# Patient Record
Sex: Male | Born: 1955 | Race: White | Hispanic: No | Marital: Married | Smoking: Former smoker
Health system: Southern US, Community
[De-identification: ages and names within clinical notes are randomized; demographics above are authoritative.]

## PROBLEM LIST (undated history)

## (undated) DIAGNOSIS — K5909 Other constipation: Secondary | ICD-10-CM

## (undated) HISTORY — DX: Other constipation: K59.09

## (undated) HISTORY — PX: HERNIA REPAIR: SHX51

---

## 1998-12-28 ENCOUNTER — Encounter: Payer: Self-pay | Admitting: *Deleted

## 1998-12-28 ENCOUNTER — Emergency Department (HOSPITAL_COMMUNITY): Admission: EM | Admit: 1998-12-28 | Discharge: 1998-12-28 | Payer: Self-pay | Admitting: *Deleted

## 2006-01-08 LAB — HM COLONOSCOPY

## 2006-11-04 HISTORY — PX: MOUTH SURGERY: SHX715

## 2012-05-22 ENCOUNTER — Ambulatory Visit (INDEPENDENT_AMBULATORY_CARE_PROVIDER_SITE_OTHER): Payer: BC Managed Care – PPO | Admitting: General Surgery

## 2012-06-10 ENCOUNTER — Ambulatory Visit (INDEPENDENT_AMBULATORY_CARE_PROVIDER_SITE_OTHER): Payer: BC Managed Care – PPO | Admitting: General Surgery

## 2012-06-11 ENCOUNTER — Ambulatory Visit (INDEPENDENT_AMBULATORY_CARE_PROVIDER_SITE_OTHER): Payer: BC Managed Care – PPO | Admitting: General Surgery

## 2012-06-11 ENCOUNTER — Encounter (INDEPENDENT_AMBULATORY_CARE_PROVIDER_SITE_OTHER): Payer: Self-pay | Admitting: General Surgery

## 2012-06-11 VITALS — BP 120/90 | HR 60 | Temp 97.8°F | Resp 16 | Ht 72.0 in | Wt 201.0 lb

## 2012-06-11 DIAGNOSIS — K4090 Unilateral inguinal hernia, without obstruction or gangrene, not specified as recurrent: Secondary | ICD-10-CM

## 2012-06-11 MED ORDER — TAMSULOSIN HCL 0.4 MG PO CAPS: 0.4000 mg | ORAL_CAPSULE | Freq: Every day | ORAL | Status: DC

## 2012-06-11 NOTE — Patient Instructions (Signed)
Hernia Repair with Laparoscope A hernia occurs when an internal organ pushes out through a weak spot in the belly (abdominal) wall muscles. Hernias most commonly occur in the groin and around the navel. Hernias can also occur through a cut by the surgeon (incision) after an abdominal operation. A hernia may be caused by:  Lifting heavy objects.   Prolonged coughing.   Straining to move your bowels.  Hernias can often be pushed back into place (reduced). Most hernias tend to get worse over time. Problems occur when abdominal contents get stuck in the opening and the blood supply is blocked or impaired (incarcerated hernia). Because of these risks, you require surgery to repair the hernia. Your hernia will be repaired using a laparoscope. Laparoscopic surgery is a type of minimally invasive surgery. It does not involve making a typical surgical cut (incision) in the skin. A laparoscope is a telescope-like rod and lens system. It is usually connected to a video camera and a light source so your caregiver can clearly see the operative area. The instruments are inserted through  to  inch (5 mm or 10 mm) openings in the skin at specific locations. A working and viewing space is created by blowing a small amount of carbon dioxide gas into the abdominal cavity. The abdomen is essentially blown up like a balloon (insufflated). This elevates the abdominal wall above the internal organs like a dome. The carbon dioxide gas is common to the human body and can be absorbed by tissue and removed by the respiratory system. Once the repair is completed, the small incisions will be closed with either stitches (sutures) or staples (just like a paper stapler only this staple holds the skin together). LET YOUR CAREGIVERS KNOW ABOUT:  Allergies.   Medications taken including herbs, eye drops, over the counter medications, and creams.   Use of steroids (by mouth or creams).   Previous problems with anesthetics or  Novocaine.   Possibility of pregnancy, if this applies.   History of blood clots (thrombophlebitis).   History of bleeding or blood problems.   Previous surgery.   Other health problems.  BEFORE THE PROCEDURE  Laparoscopy can be done either in a hospital or out-patient clinic. You may be given a mild sedative to help you relax before the procedure. Once in the operating room, you will be given a general anesthesia to make you sleep (unless you and your caregiver choose a different anesthetic).  AFTER THE PROCEDURE  After the procedure you will be watched in a recovery area. Depending on what type of hernia was repaired, you might be admitted to the hospital or you might go home the same day. With this procedure you may have less pain and scarring. This usually results in a quicker recovery and less risk of infection. HOME CARE INSTRUCTIONS   Bed rest is not required. You may continue your normal activities but avoid heavy lifting (more than 10 pounds) or straining.   Cough gently. If you are a smoker it is best to stop, as even the best hernia repair can break down with the continual strain of coughing.   Avoid driving until given the OK by your surgeon.   There are no dietary restrictions unless given otherwise.   TAKE ALL MEDICATIONS AS DIRECTED.   Only take over-the-counter or prescription medicines for pain, discomfort, or fever as directed by your caregiver.  SEEK MEDICAL CARE IF:   There is increasing abdominal pain or pain in your incisions.     There is more bleeding from incisions, other than minimal spotting.   You feel light headed or faint.   You develop an unexplained fever, chills, and/or an oral temperature above 102 F (38.9 C).   You have redness, swelling, or increasing pain in the wound.   Pus coming from wound.   A foul smell coming from the wound or dressings.  SEEK IMMEDIATE MEDICAL CARE IF:   You develop a rash.   You have difficulty breathing.    You have any allergic problems.  MAKE SURE YOU:   Understand these instructions.   Will watch your condition.   Will get help right away if you are not doing well or get worse.  Document Released: 10/21/2005 Document Revised: 10/10/2011 Document Reviewed: 09/20/2009 Exit[employer] Patient Information 2012 [employer]. 

## 2012-06-11 NOTE — Progress Notes (Signed)
Patient ID: Chad Fox, male   DOB: [birth year], 56 y.o.   MRN: [id number]  Chief Complaint  Patient presents with  . Inguinal Hernia    left    HPI Chad Fox is a 56 y.o. male.   HPI 56 year old Caucasian male comes in complaining of a left inguinal hernia. He states that this was discovered on a routine physical exam several months ago. He states that the area has gotten progressively worse over the past couple months. It bulges out more frequently and causes him more discomfort now. He describes the discomfort as a burning, stinging pain. He has always been able to reduce it. However, he states it is becoming a little more difficult to reduce it at times. He denies any fever, chills, nausea, vomiting, diarrhea. He does have baseline constipation and takes MiraLAX on a daily basis. He denies any dysuria. He denies any previous abdominal surgery  No past medical history on file.  Past Surgical History  Procedure Date  . Mouth surgery 2008    No family history on file.  Social History History  Substance Use Topics  . Smoking status: Former Smoker    Quit date: 06/11/1980  . Smokeless tobacco: Not on file  . Alcohol Use: No    No Known Allergies  Current Outpatient Prescriptions  Medication Sig Dispense Refill  . aspirin 81 MG tablet Take 81 mg by mouth daily.      . Multiple Vitamin (MULTIVITAMIN) tablet Take 1 tablet by mouth daily.      . polyethylene glycol (MIRALAX / GLYCOLAX) packet Take 17 g by mouth daily.      . Tamsulosin HCl (FLOMAX) 0.4 MG CAPS Take 1 capsule (0.4 mg total) by mouth daily.  10 capsule  0    Review of Systems Review of Systems  Constitutional: Negative for fever, chills, appetite change and unexpected weight change.  HENT: Negative for congestion and trouble swallowing.   Eyes: Negative for visual disturbance.  Respiratory: Negative for chest tightness and shortness of breath.   Cardiovascular: Negative for chest pain and leg  swelling.       No PND, no orthopnea, no DOE  Gastrointestinal: Positive for constipation.       See HPI  Genitourinary: Negative for dysuria and hematuria.       +Weak stream, frequency, nocturia  Musculoskeletal: Negative.   Skin: Negative for rash.  Neurological: Negative for seizures and speech difficulty.  Hematological: Does not bruise/bleed easily.  Psychiatric/Behavioral: Negative for behavioral problems and confusion.    Blood pressure 120/90, pulse 60, temperature 97.8 F (36.6 C), temperature source Temporal, resp. rate 16, height 6' (1.829 m), weight 201 lb (91.173 kg).  Physical Exam Physical Exam  Vitals reviewed. Constitutional: He is oriented to person, place, and time. He appears well-developed and well-nourished. No distress.  HENT:  Head: Normocephalic and atraumatic.  Right Ear: External ear normal.  Eyes: Conjunctivae are normal. No scleral icterus.  Neck: Normal range of motion. Neck supple. No tracheal deviation present. No thyromegaly present.  Cardiovascular: Normal rate, regular rhythm and normal heart sounds.   Pulmonary/Chest: Effort normal and breath sounds normal. No respiratory distress. He has no wheezes.  Abdominal: Soft. Bowel sounds are normal. There is no tenderness. There is no guarding. A hernia is present. Hernia confirmed positive in the left inguinal area. Hernia confirmed negative in the right inguinal area.  Genitourinary: Testes normal and penis normal.    Right testis shows no mass. Left testis  shows no mass.       Reducible LIH  Musculoskeletal: Normal range of motion. He exhibits no edema and no tenderness.  Lymphadenopathy:    He has no cervical adenopathy.  Neurological: He is alert and oriented to person, place, and time. He exhibits normal muscle tone.  Skin: Skin is warm and dry. No rash noted. He is not diaphoretic. No erythema.  Psychiatric: He has a normal mood and affect. His behavior is normal. Judgment and thought content  normal.    Data Reviewed None  Assessment    Left inguinal hernia    Plan    We discussed the etiology of inguinal hernias. We discussed the signs & symptoms of incarceration & strangulation.  We discussed non-operative and operative management. We discussed both open and laparoscopic techniques and the pros and cons of each  The patient has elected to proceed with LAPAROSCOPIC LEFT INGUINAL HERNIA REPAIR WITH MESH   I described the procedure in detail.  The patient was given educational material. We discussed the risks and benefits including but not limited to bleeding, infection, chronic inguinal pain, nerve entrapment, hernia recurrence, mesh complications, hematoma formation, urinary retention, injury to the testicles, numbness in the groin, blood clots, injury to the surrounding structures, and anesthesia risk. We also discussed the typical post operative recovery course, including no heavy lifting for 4-6 weeks. I explained that the likelihood of improvement of their symptoms is good.  Mary Sella. Andrey Campanile, MD, FACS General, Bariatric, & Minimally Invasive Surgery [facility], [place]         [facility] M 06/11/2012, 5:12 PM

## 2012-06-25 DIAGNOSIS — K4090 Unilateral inguinal hernia, without obstruction or gangrene, not specified as recurrent: Secondary | ICD-10-CM

## 2012-06-25 HISTORY — PX: INGUINAL HERNIA REPAIR: SUR1180

## 2012-06-26 ENCOUNTER — Telehealth (INDEPENDENT_AMBULATORY_CARE_PROVIDER_SITE_OTHER): Payer: Self-pay

## 2012-06-26 NOTE — Telephone Encounter (Signed)
Pt given po appt. 

## 2012-07-22 ENCOUNTER — Ambulatory Visit (INDEPENDENT_AMBULATORY_CARE_PROVIDER_SITE_OTHER): Payer: BC Managed Care – PPO | Admitting: General Surgery

## 2012-07-22 ENCOUNTER — Encounter (INDEPENDENT_AMBULATORY_CARE_PROVIDER_SITE_OTHER): Payer: Self-pay | Admitting: General Surgery

## 2012-07-22 VITALS — BP 112/80 | HR 60 | Temp 97.6°F | Resp 14 | Ht 72.0 in | Wt 199.0 lb

## 2012-07-22 DIAGNOSIS — Z09 Encounter for follow-up examination after completed treatment for conditions other than malignant neoplasm: Secondary | ICD-10-CM

## 2012-07-22 NOTE — Patient Instructions (Signed)
You can resume light activities. Wait another 2 weeks before doing heavy lifting

## 2012-07-22 NOTE — Progress Notes (Signed)
Subjective:     Patient ID: Chad Fox, male   DOB: 25-Dec-1955, 55 y.o.   MRN: [id number]  HPI 56 year old Caucasian male comes in for followup after undergoing a laparoscopic repair of a left indirect inguinal hernia with mesh on August 22 at the surgical center [facility]. He states that he did quite well after surgery. He did not take any narcotics after surgery. He did have some soreness for a couple of days. He returned to work the following Monday. He denies any fever, chills, nausea, vomiting, diarrhea or constipation. He denies any numbness or tingling in his groin. He denies any swelling in the scrotum  Review of Systems     Objective:   Physical Exam BP 112/80  Pulse 60  Temp 97.6 F (36.4 C) (Temporal)  Resp 14  Ht 6' (1.829 m)  Wt 199 lb (90.266 kg)  BMI 26.99 kg/m2  Gen: alert, NAD, non-toxic appearing Abd: soft, nontender, nondistended. Well-healed trocar sites. No cellulitis. No incisional hernia GU: both testicles descended, no scrotal masses, no sign of hernia recurrence     Assessment:     Status post laparoscopic repair of left indirect inguinal hernia repair with mesh    Plan:     I'm very glad he had a good postoperative course. I have released him to light cardiovascular activity such as bowling or riding a bicycle. I encouraged him to wait another 2 weeks before doing full strenuous activities. Followup as needed  Mary Sella. Andrey Campanile, MD, FACS General, Bariatric, & Minimally Invasive Surgery [facility], [place]

## 2014-01-31 ENCOUNTER — Ambulatory Visit (INDEPENDENT_AMBULATORY_CARE_PROVIDER_SITE_OTHER): Payer: BC Managed Care – PPO | Admitting: Physician Assistant

## 2014-01-31 ENCOUNTER — Encounter: Payer: Self-pay | Admitting: Physician Assistant

## 2014-01-31 VITALS — BP 128/86 | HR 76 | Temp 98.1°F | Resp 18 | Ht 71.0 in | Wt 222.0 lb

## 2014-01-31 DIAGNOSIS — A499 Bacterial infection, unspecified: Secondary | ICD-10-CM

## 2014-01-31 DIAGNOSIS — B9689 Other specified bacterial agents as the cause of diseases classified elsewhere: Secondary | ICD-10-CM

## 2014-01-31 DIAGNOSIS — J988 Other specified respiratory disorders: Secondary | ICD-10-CM

## 2014-01-31 MED ORDER — AZITHROMYCIN 250 MG PO TABS: ORAL_TABLET | ORAL | Status: DC

## 2014-01-31 NOTE — Progress Notes (Signed)
    Patient ID: Chad Fox MRN: [id number], DOB: 1955/12/14, 58 y.o. Date of Encounter: 01/31/2014, 11:30 AM    Chief Complaint:  Chief Complaint  Patient presents with  . sinus infection    facial pain, post nasal drip, cough x 5 days     HPI: 58 y.o. year old white male reports that he has been sick for about 5 days. Says that his head feels like it's about to explode. Is taking Sudafed which helps the symptoms quite a bit. Also has a cough and has been taking Delsym. However is not able to get up any phlegm. Also has NOT been blowing any mucus from his nose. Says he may have had a low-grade fever 2 days ago but otherwise no fevers or chills. No significant sore throat and no ear ache.     Home Meds: See attached medication section for any medications that were entered at today's visit. The computer does not put those onto this list.The following list is a list of meds entered prior to today's visit.   Current Outpatient Prescriptions on File Prior to Visit  Medication Sig Dispense Refill  . aspirin 81 MG tablet Take 81 mg by mouth daily.      . Multiple Vitamin (MULTIVITAMIN) tablet Take 1 tablet by mouth daily.      . polyethylene glycol (MIRALAX / GLYCOLAX) packet Take 17 g by mouth daily.       No current facility-administered medications on file prior to visit.    Allergies: No Known Allergies    Review of Systems: See HPI for pertinent ROS. All other ROS negative.    Physical Exam: Blood pressure 128/86, pulse 76, temperature 98.1 F (36.7 C), temperature source Oral, resp. rate 18, height 5\' 11"  (1.803 m), weight 222 lb (100.699 kg)., Body mass index is 30.98 kg/(m^2). General: WNWD WM.  Appears in no acute distress. HEENT: Normocephalic, atraumatic, eyes without discharge, sclera non-icteric, nares are without discharge. Bilateral auditory canals clear, TM's are without perforation, pearly grey and translucent with reflective cone of light bilaterally. Oral  cavity moist, posterior pharynx without exudate, erythema, peritonsillar abscess. No tenderness with percussion of frontal or maxillary sinuses bilaterally.  Neck: Supple. No thyromegaly. No lymphadenopathy. Lungs: Clear bilaterally to auscultation without wheezes, rales, or rhonchi. Breathing is unlabored. Heart: Regular rhythm. No murmurs, rubs, or gallops. Msk:  Strength and tone normal for age. Extremities/Skin: Warm and dry.  No rashes. Neuro: Alert and oriented X 3. Moves all extremities spontaneously. Gait is normal. CNII-XII grossly in tact. Psych:  Responds to questions appropriately with a normal affect.     ASSESSMENT AND PLAN:  58 y.o. year old male with  1. Bacterial respiratory infection - azithromycin (ZITHROMAX) 250 MG tablet; Day 1: Take 2 daily.  Days 2-5: Take 1 daily.  Dispense: 6 tablet; Refill: 0 Continue over-the-counter Sudafed and Delsym to help control the symptoms. Follow up if symptoms do not resolve within one week after completion of antibiotics. I offered a note for him to be out of work but he states that this is not necessary and he has too much work to do to be out of work.  Signed, [address]Kandice Schmelter Beth El CapitanDixon, [place], [facility] 01/31/2014 11:30 AM

## 2014-10-18 ENCOUNTER — Other Ambulatory Visit: Payer: BC Managed Care – PPO

## 2014-10-18 ENCOUNTER — Other Ambulatory Visit: Payer: Self-pay | Admitting: Physician Assistant

## 2014-10-18 DIAGNOSIS — Z125 Encounter for screening for malignant neoplasm of prostate: Secondary | ICD-10-CM

## 2014-10-18 DIAGNOSIS — Z0000 Encounter for general adult medical examination without abnormal findings: Secondary | ICD-10-CM

## 2014-10-18 LAB — COMPLETE METABOLIC PANEL WITH GFR
ALT: 12 U/L (ref 0–53)
AST: 19 U/L (ref 0–37)
Albumin: 4.4 g/dL (ref 3.5–5.2)
Alkaline Phosphatase: 67 U/L (ref 39–117)
BUN: 8 mg/dL (ref 6–23)
CO2: 26 mEq/L (ref 19–32)
Calcium: 9.7 mg/dL (ref 8.4–10.5)
Chloride: 107 mEq/L (ref 96–112)
Creat: 0.86 mg/dL (ref 0.50–1.35)
GFR, Est African American: >89 mL/min
GFR, Est Non African American: >89 mL/min
Glucose, Bld: 114 mg/dL — ABNORMAL HIGH (ref 70–99)
Potassium: 4.8 mEq/L (ref 3.5–5.3)
Sodium: 141 mEq/L (ref 135–145)
Total Bilirubin: 0.4 mg/dL (ref 0.2–1.2)
Total Protein: 7 g/dL (ref 6.0–8.3)

## 2014-10-18 LAB — CBC WITH DIFFERENTIAL/PLATELET
Basophils Absolute: 0.1 10*3/uL (ref 0.0–0.1)
Basophils Relative: 1 % (ref 0–1)
Eosinophils Absolute: 0.3 10*3/uL (ref 0.0–0.7)
Eosinophils Relative: 5 % (ref 0–5)
HCT: 35.6 % — ABNORMAL LOW (ref 39.0–52.0)
HEMOGLOBIN: 12.2 g/dL — AB (ref 13.0–17.0)
LYMPHS PCT: 37 % (ref 12–46)
Lymphs Abs: 2.1 10*3/uL (ref 0.7–4.0)
MCH: 28.1 pg (ref 26.0–34.0)
MCHC: 34.3 g/dL (ref 30.0–36.0)
MCV: 82 fL (ref 78.0–100.0)
MPV: 9.7 fL (ref 9.4–12.4)
Monocytes Absolute: 0.7 10*3/uL (ref 0.1–1.0)
Monocytes Relative: 12 % (ref 3–12)
NEUTROS ABS: 2.6 10*3/uL (ref 1.7–7.7)
Neutrophils Relative %: 45 % (ref 43–77)
Platelets: 210 10*3/uL (ref 150–400)
RBC: 4.34 MIL/uL (ref 4.22–5.81)
RDW: 15 % (ref 11.5–15.5)
WBC: 5.8 10*3/uL (ref 4.0–10.5)

## 2014-10-18 LAB — LIPID PANEL
Cholesterol: 184 mg/dL (ref 0–200)
HDL: 41 mg/dL (ref >39)
LDL Cholesterol: 115 mg/dL — ABNORMAL HIGH (ref 0–99)
Total CHOL/HDL Ratio: 4.5 Ratio
Triglycerides: 142 mg/dL (ref <150)
VLDL: 28 mg/dL (ref 0–40)

## 2014-10-18 LAB — TSH: TSH: 2.46 u[IU]/mL (ref 0.350–4.500)

## 2014-10-19 ENCOUNTER — Encounter: Payer: Self-pay | Admitting: Physician Assistant

## 2014-10-19 ENCOUNTER — Ambulatory Visit (INDEPENDENT_AMBULATORY_CARE_PROVIDER_SITE_OTHER): Payer: BC Managed Care – PPO | Admitting: Physician Assistant

## 2014-10-19 VITALS — BP 134/84 | HR 68 | Temp 98.0°F | Resp 18 | Ht 71.0 in | Wt 223.0 lb

## 2014-10-19 DIAGNOSIS — R739 Hyperglycemia, unspecified: Secondary | ICD-10-CM

## 2014-10-19 DIAGNOSIS — K5909 Other constipation: Secondary | ICD-10-CM | POA: Insufficient documentation

## 2014-10-19 DIAGNOSIS — Z0000 Encounter for general adult medical examination without abnormal findings: Secondary | ICD-10-CM

## 2014-10-19 DIAGNOSIS — K5900 Constipation, unspecified: Secondary | ICD-10-CM

## 2014-10-19 LAB — PSA: PSA: 0.89 ng/mL (ref <=4.00)

## 2014-10-19 LAB — HEMOGLOBIN A1C
HEMOGLOBIN A1C: 6.2 % — AB (ref <5.7)
Mean Plasma Glucose: 131 mg/dL — ABNORMAL HIGH (ref <117)

## 2014-10-19 NOTE — Progress Notes (Signed)
Patient ID: Chad Fox MRN: [id number], DOB: 03/13/1956 58 y.o. Date of Encounter: 10/19/2014, 11:55 AM    Chief Complaint: Physical (CPE)  HPI: 58 y.o. y/owhite male here for CPE.   He has no specific complaints or concerns today. He came yesterday for fasting lab work.   Review of Systems: Consitutional: No fever, chills, fatigue, night sweats, lymphadenopathy, or weight changes. Eyes: No visual changes, eye redness, or discharge. ENT/Mouth: Ears: No otalgia, tinnitus, hearing loss, discharge. Nose: No congestion, rhinorrhea, sinus pain, or epistaxis. Throat: No sore throat, post nasal drip, or teeth pain. Cardiovascular: No CP, palpitations, diaphoresis, DOE, edema, orthopnea, PND. Respiratory: No cough, hemoptysis, SOB, or wheezing. Gastrointestinal: No anorexia, dysphagia, reflux, pain, nausea, vomiting, hematemesis, diarrhea, constipation, BRBPR, or melena. Genitourinary: No dysuria, frequency, urgency, hematuria, incontinence, nocturia, decreased urinary stream, discharge, impotence, or testicular pain/masses. Musculoskeletal: No decreased ROM, myalgias, stiffness, joint swelling, or weakness. Skin: No rash, erythema, lesion changes, pain, warmth, jaundice, or pruritis. Neurological: No headache, dizziness, syncope, seizures, tremors, memory loss, coordination problems, or paresthesias. Psychological: No anxiety, depression, hallucinations, SI/HI. Endocrine: No fatigue, polydipsia, polyphagia, polyuria, or known diabetes. All other systems were reviewed and are otherwise negative.  Past Medical History  Diagnosis Date  . Chronic constipation      Past Surgical History  Procedure Laterality Date  . Mouth surgery  2008  . Inguinal hernia repair  06/25/2012    left    Home Meds:  Outpatient Prescriptions Prior to Visit  Medication Sig Dispense Refill  . aspirin 81 MG tablet Take 81 mg by mouth daily.    Marland Kitchen. GLUCOSAMINE HCL PO Take by mouth.    . Multiple  Vitamin (MULTIVITAMIN) tablet Take 1 tablet by mouth daily.    . polyethylene glycol (MIRALAX / GLYCOLAX) packet Take 17 g by mouth daily.    Marland Kitchen. azithromycin (ZITHROMAX) 250 MG tablet Day 1: Take 2 daily.  Days 2-5: Take 1 daily. 6 tablet 0   No facility-administered medications prior to visit.    Allergies: No Known Allergies  History   Social History  . Marital Status: Married    Spouse Name: N/A    Number of Children: N/A  . Years of Education: N/A   Occupational History  . Not on file.   Social History Main Topics  . Smoking status: Former Smoker    Quit date: 06/11/1980  . Smokeless tobacco: Never Used  . Alcohol Use: No  . Drug Use: No  . Sexual Activity: Not on file   Other Topics Concern  . Not on file   Social History Narrative   Works in [employer]    Married. 1 child. 3 grandchildren.    No exercise.    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     Family History  Problem Relation Age of Onset  . Glaucoma Mother     Physical Exam: Blood pressure 134/84, pulse 68, temperature 98 F (36.7 C), temperature source Oral, resp. rate 18, height 5\' 11"  (1.803 m), weight 223 lb (101.152 kg).  General: Well developed, well nourished, WM. Appears in no acute distress. HEENT: Normocephalic, atraumatic. Conjunctiva pink, sclera non-icteric. Pupils 2 mm constricting to 1 mm, round, regular, and equally reactive to light and accomodation. EOMI. Internal auditory canal clear. TMs with good cone of light and without pathology. Nasal mucosa pink. Nares are without discharge. No sinus tenderness. Oral mucosa pink. Dentition normal. Pharynx without exudate.   Neck: Supple. Trachea midline. No thyromegaly. Full  ROM. No lymphadenopathy. Lungs: Clear to auscultation bilaterally without wheezes, rales, or rhonchi. Breathing is of normal effort and unlabored. Cardiovascular: RRR with S1 S2. No murmurs, rubs, or gallops. Distal pulses 2+ symmetrically. No carotid or abdominal  bruits. Abdomen: Soft, non-tender, non-distended with normoactive bowel sounds. No hepatosplenomegaly or masses. No rebound/guarding. No CVA tenderness. No hernias. Rectal: Deferred. Musculoskeletal: Full range of motion and 5/5 strength throughout. Without swelling, atrophy, tenderness, crepitus, or warmth. Extremities without clubbing, cyanosis, or edema.. Skin: Warm and moist without erythema, ecchymosis, wounds, or rash. Neuro: A+Ox3. CN II-XII grossly intact. Moves all extremities spontaneously. Full sensation throughout. Normal gait.  Psych:  Responds to questions appropriately with a normal affect.   Assessment/Plan:  58 y.o. y/o  white male here for CPE -1. Visit for preventive health examination  A. Screening Labs: He came for fasting labs yesterday. CMET is normal except for glucose 114. Hemoglobin A1c has been added. TSH normal. Lipid panel normal. Triglyceride 142. HDL 41. LDL 115. CBC is normal except hemoglobin 12.2 hematocrit 35.6. MCV and MCH are normal. Patient states that he donates blood every 8 weeks and that his last donation was 4 weeks ago. His findings are consistent with blood loss anemia. PSA normal at 0.89.  B. Screening For Prostate Cancer: PSA Normal.   C. Screening For Colorectal Cancer:  He had colonoscopy by Dr. Leary RocaMark Magod 01/08/2006. Normal. Repeat 10 years.  D. Immunizations: Flu--I discussed influenza vaccine and recommended it but he defers. Tetanus--he received tetanus vaccine here 2007. Pneumococcal--he has no indication to need pneumonia vaccine until age 58. Zostavax-----he states that he received Zostavax 07/2013.     2. Chronic constipation He says that he uses one capful of MiraLAX daily. Says that this works well for him. Offered trying Linzess which would be a pill that he could take but he says that his current treatment is working fine and he does not want to change.  3. Hyperglycemia Add Hgb A1C   I will follow-up with patient  once I get the hemoglobin A1c results. If he needs follow-up regarding this, then  we will tell him at that time. Otherwise can wait one year for for routine follow-up.   Signed:   [address]Mary Beth SpringertonDixon,[place], [place]  10/19/2014 11:55 AM

## 2014-11-29 ENCOUNTER — Encounter: Payer: Self-pay | Admitting: Family Medicine

## 2014-11-29 DIAGNOSIS — M19021 Primary osteoarthritis, right elbow: Secondary | ICD-10-CM

## 2014-11-29 DIAGNOSIS — M1990 Unspecified osteoarthritis, unspecified site: Secondary | ICD-10-CM | POA: Insufficient documentation

## 2015-01-19 ENCOUNTER — Other Ambulatory Visit: Payer: BLUE CROSS/BLUE SHIELD

## 2015-01-19 DIAGNOSIS — M1990 Unspecified osteoarthritis, unspecified site: Secondary | ICD-10-CM

## 2015-01-19 DIAGNOSIS — Z79899 Other long term (current) drug therapy: Secondary | ICD-10-CM

## 2015-01-19 LAB — COMPLETE METABOLIC PANEL WITH GFR
ALT: 13 U/L (ref 0–53)
AST: 17 U/L (ref 0–37)
Albumin: 4.2 g/dL (ref 3.5–5.2)
Alkaline Phosphatase: 66 U/L (ref 39–117)
BUN: 14 mg/dL (ref 6–23)
CALCIUM: 9.3 mg/dL (ref 8.4–10.5)
CHLORIDE: 106 meq/L (ref 96–112)
CO2: 22 meq/L (ref 19–32)
Creat: 0.86 mg/dL (ref 0.50–1.35)
GFR, Est African American: >89 mL/min
Glucose, Bld: 101 mg/dL — ABNORMAL HIGH (ref 70–99)
POTASSIUM: 4.4 meq/L (ref 3.5–5.3)
SODIUM: 140 meq/L (ref 135–145)
Total Bilirubin: 0.5 mg/dL (ref 0.2–1.2)
Total Protein: 6.5 g/dL (ref 6.0–8.3)

## 2015-01-19 LAB — TSH: TSH: 1.973 u[IU]/mL (ref 0.350–4.500)

## 2015-01-19 LAB — CBC WITH DIFFERENTIAL/PLATELET
BASOS ABS: 0 10*3/uL (ref 0.0–0.1)
Basophils Relative: 1 % (ref 0–1)
EOS PCT: 4 % (ref 0–5)
Eosinophils Absolute: 0.2 10*3/uL (ref 0.0–0.7)
HCT: 38.1 % — ABNORMAL LOW (ref 39.0–52.0)
HEMOGLOBIN: 12.7 g/dL — AB (ref 13.0–17.0)
LYMPHS ABS: 1.8 10*3/uL (ref 0.7–4.0)
Lymphocytes Relative: 38 % (ref 12–46)
MCH: 27.5 pg (ref 26.0–34.0)
MCHC: 33.3 g/dL (ref 30.0–36.0)
MCV: 82.6 fL (ref 78.0–100.0)
MONO ABS: 0.5 10*3/uL (ref 0.1–1.0)
MPV: 10.1 fL (ref 8.6–12.4)
Monocytes Relative: 10 % (ref 3–12)
Neutro Abs: 2.2 10*3/uL (ref 1.7–7.7)
Neutrophils Relative %: 47 % (ref 43–77)
Platelets: 170 10*3/uL (ref 150–400)
RBC: 4.61 MIL/uL (ref 4.22–5.81)
RDW: 15.9 % — ABNORMAL HIGH (ref 11.5–15.5)
WBC: 4.7 10*3/uL (ref 4.0–10.5)

## 2015-01-19 LAB — LIPID PANEL
CHOL/HDL RATIO: 4.1 ratio
Cholesterol: 156 mg/dL (ref 0–200)
HDL: 38 mg/dL — AB (ref >=40)
LDL Cholesterol: 99 mg/dL (ref 0–99)
TRIGLYCERIDES: 95 mg/dL (ref <150)
VLDL: 19 mg/dL (ref 0–40)

## 2015-01-20 LAB — HEMOGLOBIN A1C
Hgb A1c MFr Bld: 6.1 % — ABNORMAL HIGH (ref <5.7)
MEAN PLASMA GLUCOSE: 128 mg/dL — AB (ref <117)

## 2015-01-20 LAB — PSA: PSA: 1.43 ng/mL (ref <=4.00)

## 2015-01-23 ENCOUNTER — Encounter: Payer: Self-pay | Admitting: Physician Assistant

## 2015-01-23 ENCOUNTER — Ambulatory Visit (INDEPENDENT_AMBULATORY_CARE_PROVIDER_SITE_OTHER): Payer: BLUE CROSS/BLUE SHIELD | Admitting: Physician Assistant

## 2015-01-23 VITALS — BP 128/72 | HR 72 | Temp 97.8°F | Resp 18 | Ht 72.0 in | Wt 215.0 lb

## 2015-01-23 DIAGNOSIS — R739 Hyperglycemia, unspecified: Secondary | ICD-10-CM

## 2015-01-23 DIAGNOSIS — K5900 Constipation, unspecified: Secondary | ICD-10-CM | POA: Diagnosis not present

## 2015-01-23 DIAGNOSIS — K5909 Other constipation: Secondary | ICD-10-CM

## 2015-01-23 NOTE — Progress Notes (Signed)
    Patient ID: Chad Fox MRN: [id number], DOB: September 21, 1956, 59 y.o. Date of Encounter: 01/23/2015, 12:12 PM    Chief Complaint:  Chief Complaint  Patient presents with  . Follow-up    bloodwork and BS a little high     HPI: 59 y.o. year old white male here for follow-up office visit regarding hyperglycemia/borderline diabetes.  He recently had complete physical exam by me 10/19/14. Lab work at that time showed glucose 114 so A1c was added and came back at 6.2. Patient states that we did mail him a handout regarding low carbohydrate diet.  He says that he has made some diet changes and has lost some weight secondary to this. Says that he usually eats an apple and an orange every day and says this may be contributing to his sugar.  Says that since 10/2014--he has decreased his pasta and bread but has still been eating his apple and orange daily. Says that even prior to 10/2014 he wasn't really drinking any soda or sweet tea. Says that he basically just drinks water and black coffee.     Home Meds:   Outpatient Prescriptions Prior to Visit  Medication Sig Dispense Refill  . aspirin 81 MG tablet Take 81 mg by mouth daily.    Marland Kitchen. GLUCOSAMINE HCL PO Take by mouth.    . Multiple Vitamin (MULTIVITAMIN) tablet Take 1 tablet by mouth daily.    . polyethylene glycol (MIRALAX / GLYCOLAX) packet Take 17 g by mouth daily.     No facility-administered medications prior to visit.    Allergies: No Known Allergies    Review of Systems: See HPI for pertinent ROS. All other ROS negative.    Physical Exam: Blood pressure 128/72, pulse 72, temperature 97.8 F (36.6 C), temperature source Oral, resp. rate 18, height 6' (1.829 m), weight 215 lb (97.523 kg)., Body mass index is 29.15 kg/(m^2). General:  WNWD WM. Appears in no acute distress. Lungs: Clear bilaterally to auscultation without wheezes, rales, or rhonchi. Breathing is unlabored. Heart: Regular rhythm. No murmurs, rubs, or  gallops. Msk:  Strength and tone normal for age. Extremities/Skin: Warm and dry.  Neuro: Alert and oriented X 3. Moves all extremities spontaneously. Gait is normal. CNII-XII grossly in tact. Psych:  Responds to questions appropriately with a normal affect.     ASSESSMENT AND PLAN:  59 y.o. year old male with  1. Hyperglycemia  I reviewed the low carbohydrate diet handout with him today. Viewed with him which foods affect sugar at the most and which of these 2 limits if just trying to control sugar. Reviewed serving sizes and portion sizes.  Also reviewed the fact that his LDL did decrease from 115  To 99 just over this 3 month period. Therefore can hold off on statin.  Also, he states that many years ago he had total been told that he had borderline diabetes--- says that this is chronic and stable.  He is not obese and his diet really does not seem that bad. Was wondering if there is a genetic component to his hyperglycemia. He says that his maternal grandmother had diabetes but no other family history of diabetes. Neither of his parents have diabetes and none of his siblings have diabetes.  Can wait 6 months for follow-up visit.  Murray HodgkinsSigned, Chad Fox, [place], [place] 01/23/2015 12:12 PM

## 2016-03-06 ENCOUNTER — Ambulatory Visit (INDEPENDENT_AMBULATORY_CARE_PROVIDER_SITE_OTHER): Payer: BLUE CROSS/BLUE SHIELD | Admitting: Physician Assistant

## 2016-03-06 ENCOUNTER — Encounter: Payer: Self-pay | Admitting: Physician Assistant

## 2016-03-06 DIAGNOSIS — J45909 Unspecified asthma, uncomplicated: Secondary | ICD-10-CM | POA: Diagnosis not present

## 2016-03-06 DIAGNOSIS — J309 Allergic rhinitis, unspecified: Secondary | ICD-10-CM

## 2016-03-06 DIAGNOSIS — R0982 Postnasal drip: Secondary | ICD-10-CM

## 2016-03-06 MED ORDER — HYDROCODONE-HOMATROPINE 5-1.5 MG/5ML PO SYRP: 5.0000 mL | ORAL_SOLUTION | Freq: Three times a day (TID) | ORAL | Status: DC | PRN

## 2016-03-06 MED ORDER — PREDNISONE 20 MG PO TABS: 20.0000 mg | ORAL_TABLET | Freq: Every day | ORAL | Status: DC

## 2016-03-06 MED ORDER — CETIRIZINE HCL 10 MG PO TABS: 10.0000 mg | ORAL_TABLET | Freq: Every day | ORAL | Status: DC

## 2016-03-07 NOTE — Progress Notes (Signed)
    Patient ID: Chad MoanKeith D Ernster MRN: [id number], DOB: 06-29-1956, 60 y.o. Date of Encounter: 03/07/2016, 7:38 AM    Chief Complaint:  Chief Complaint  Patient presents with  . sick x 1 week    mowed grass on Friday has had deep nagging cough ever since     HPI: 60 y.o. year old white male presents with above.   Says that on Friday , 5 days ago, he mowed the grass. Says that since then he has had a hacky, nagging cough. Says that he does not feel like there is any phlegm or congestion in his throat or chest. Says that it is dry hacky cough. Has had no sneezing or rhinorrhea and no mucus from the nose. No fevers or chills. No sore throat or earache. Coughs during the night and then about the time he falls back asleep cough again.     Home Meds:   Outpatient Prescriptions Prior to Visit  Medication Sig Dispense Refill  . aspirin 81 MG tablet Take 81 mg by mouth daily.    Marland Kitchen. GLUCOSAMINE HCL PO Take by mouth.    . Multiple Vitamin (MULTIVITAMIN) tablet Take 1 tablet by mouth daily.    . polyethylene glycol (MIRALAX / GLYCOLAX) packet Take 17 g by mouth daily.     No facility-administered medications prior to visit.    Allergies: No Known Allergies    Review of Systems: See HPI for pertinent ROS. All other ROS negative.    Physical Exam: Blood pressure 132/86, pulse 72, temperature 98.1 F (36.7 C), temperature source Oral, resp. rate 18, weight 222 lb (100.699 kg)., Body mass index is 30.1 kg/(m^2). General:  WNWD WM. Appears in no acute distress. HEENT: Normocephalic, atraumatic, eyes without discharge, sclera non-icteric, nares are without discharge. Bilateral auditory canals clear, TM's are without perforation, pearly grey and translucent with reflective cone of light bilaterally. Oral cavity moist, posterior pharynx without exudate, erythema, peritonsillar abscess, or post nasal drip.  Neck: Supple. No thyromegaly. No lymphadenopathy. Lungs: Clear bilaterally to auscultation  without wheezes, rales, or rhonchi. Breathing is unlabored. Heart: Regular rhythm. No murmurs, rubs, or gallops. Msk:  Strength and tone normal for age. Extremities/Skin: Warm and dry. Neuro: Alert and oriented X 3. Moves all extremities spontaneously. Gait is normal. CNII-XII grossly in tact. Psych:  Responds to questions appropriately with a normal affect.     ASSESSMENT AND PLAN:  60 y.o. year old male with  1. Allergic rhinitis with postnasal drip Take the Zyrtec at night and take the prednisone in the morning. Also use the cough syrup at night. If develops phlegm or fever then follow-up or symptoms do not resolve in one week then follow-up. - cetirizine (ZYRTEC) 10 MG tablet; Take 1 tablet (10 mg total) by mouth daily.  Dispense: 30 tablet; Refill: 0 - predniSONE (DELTASONE) 20 MG tablet; Take 1 tablet (20 mg total) by mouth daily with breakfast.  Dispense: 5 tablet; Refill: 0 - HYDROcodone-homatropine (HYCODAN) 5-1.5 MG/5ML syrup; Take 5 mLs by mouth every 8 (eight) hours as needed for cough.  Dispense: 120 mL; Refill: 0   Signed, [address] Beth [place], [place], [facility] 03/07/2016 7:38 AM

## 2016-03-11 ENCOUNTER — Telehealth: Payer: Self-pay | Admitting: Physician Assistant

## 2016-03-11 MED ORDER — AZITHROMYCIN 250 MG PO TABS: ORAL_TABLET | ORAL | Status: DC

## 2016-03-11 NOTE — Telephone Encounter (Signed)
Prescription sent to pharmacy.

## 2016-03-11 NOTE — Telephone Encounter (Signed)
Pt states that the allergy medicine that MB prescribed him is not working and he is hoping that [facility] can [facility] [place].

## 2016-03-11 NOTE — Telephone Encounter (Signed)
Azithromycin 250mg :  Day 1: Take 2 daily.  Days 2-5: Take 1 daily. #6 + 0. If symptoms do not resolve after completes this, then come back for f/u OV to further evaluate.

## 2016-09-24 ENCOUNTER — Encounter: Payer: Self-pay | Admitting: Physician Assistant

## 2016-09-24 ENCOUNTER — Ambulatory Visit (INDEPENDENT_AMBULATORY_CARE_PROVIDER_SITE_OTHER): Payer: BLUE CROSS/BLUE SHIELD | Admitting: Physician Assistant

## 2016-09-24 VITALS — BP 132/78 | HR 78 | Temp 98.3°F | Resp 18 | Wt 229.0 lb

## 2016-09-24 DIAGNOSIS — B9689 Other specified bacterial agents as the cause of diseases classified elsewhere: Principal | ICD-10-CM

## 2016-09-24 DIAGNOSIS — J988 Other specified respiratory disorders: Secondary | ICD-10-CM

## 2016-09-24 MED ORDER — HYDROCODONE-HOMATROPINE 5-1.5 MG/5ML PO SYRP: 5.0000 mL | ORAL_SOLUTION | Freq: Three times a day (TID) | ORAL | 0 refills | Status: DC | PRN

## 2016-09-24 MED ORDER — AZITHROMYCIN 250 MG PO TABS: ORAL_TABLET | ORAL | 0 refills | Status: DC

## 2016-09-24 NOTE — Progress Notes (Signed)
Patient ID: Chad Fox MRN: [id number], DOB: 06-01-1956, 60 y.o. Date of Encounter: 09/24/2016, 4:01 PM    Chief Complaint:  Chief Complaint  Patient presents with  . congested  . Cough     HPI: 60 y.o. year old male presetns with above.   Says that symptoms started 4 days ago that are getting worse. He feels drainage in his throat and chest congestion. Having a lot of cough. Able to get up very little phlegm. Says he really hasn't had any congestion in his nose. Hasn't had any body aches and overall really doesn't feel that bad just this cough. Has had no known fever. No significant amount of sore throat only a little secondary to the coughing.  No ear pain.     Home Meds:   Outpatient Medications Prior to Visit  Medication Sig Dispense Refill  . aspirin 81 MG tablet Take 81 mg by mouth daily.    . cetirizine (ZYRTEC) 10 MG tablet Take 1 tablet (10 mg total) by mouth daily. 30 tablet 0  . GLUCOSAMINE HCL PO Take by mouth.    . Multiple Vitamin (MULTIVITAMIN) tablet Take 1 tablet by mouth daily.    . polyethylene glycol (MIRALAX / GLYCOLAX) packet Take 17 g by mouth daily.    Marland Kitchen. azithromycin (ZITHROMAX) 250 MG tablet Take (2) tablets by mouth on day 1, then take (1) tablet by mouth on days 2-5. (Patient not taking: Reported on 09/24/2016) 6 tablet 0  . HYDROcodone-homatropine (HYCODAN) 5-1.5 MG/5ML syrup Take 5 mLs by mouth every 8 (eight) hours as needed for cough. (Patient not taking: Reported on 09/24/2016) 120 mL 0  . predniSONE (DELTASONE) 20 MG tablet Take 1 tablet (20 mg total) by mouth daily with breakfast. (Patient not taking: Reported on 09/24/2016) 5 tablet 0   No facility-administered medications prior to visit.     Allergies: No Known Allergies    Review of Systems: See HPI for pertinent ROS. All other ROS negative.    Physical Exam: Blood pressure 132/78, pulse 78, temperature 98.3 F (36.8 C), temperature source Oral, resp. rate 18, weight 229 lb  (103.9 kg), SpO2 96 %., Body mass index is 31.06 kg/m. General:  WNWD WM. Appears in no acute distress. HEENT: Normocephalic, atraumatic, eyes without discharge, sclera non-icteric, nares are without discharge. Bilateral auditory canals clear, TM's are without perforation, pearly grey and translucent with reflective cone of light bilaterally. Oral cavity moist, posterior pharynx without exudate, erythema, peritonsillar abscess.  Neck: Supple. No thyromegaly. No lymphadenopathy. Lungs: Clear bilaterally to auscultation without wheezes, rales, or rhonchi. Breathing is unlabored. Heart: Regular rhythm. No murmurs, rubs, or gallops. Msk:  Strength and tone normal for age. Extremities/Skin: Warm and dry.  Neuro: Alert and oriented X 3. Moves all extremities spontaneously. Gait is normal. CNII-XII grossly in tact. Psych:  Responds to questions appropriately with a normal affect.     ASSESSMENT AND PLAN:  60 y.o. year old male with  1. Bacterial respiratory infection He is to take antibiotic as directed. An use the Hycodan as needed for cough suppressant. Follow-up if symptoms do not resolve within 1 week after completion of antibiotic. - azithromycin (ZITHROMAX) 250 MG tablet; Day 1: Take 2 daily. Days 2-5: take 1 daily.  Dispense: 6 tablet; Refill: 0 - HYDROcodone-homatropine (HYCODAN) 5-1.5 MG/5ML syrup; Take 5 mLs by mouth every 8 (eight) hours as needed for cough.  Dispense: 120 mL; Refill: 0   Signed, [address] [place]ixon, [place], [facility] 09/24/2016 4:01  PM   

## 2016-12-25 ENCOUNTER — Ambulatory Visit (INDEPENDENT_AMBULATORY_CARE_PROVIDER_SITE_OTHER): Payer: BLUE CROSS/BLUE SHIELD | Admitting: Physician Assistant

## 2016-12-25 ENCOUNTER — Encounter: Payer: Self-pay | Admitting: Physician Assistant

## 2016-12-25 VITALS — BP 130/78 | HR 87 | Temp 98.8°F | Resp 16 | Wt 221.6 lb

## 2016-12-25 DIAGNOSIS — B9689 Other specified bacterial agents as the cause of diseases classified elsewhere: Principal | ICD-10-CM

## 2016-12-25 DIAGNOSIS — J988 Other specified respiratory disorders: Secondary | ICD-10-CM | POA: Diagnosis not present

## 2016-12-25 MED ORDER — AZITHROMYCIN 250 MG PO TABS: ORAL_TABLET | ORAL | 0 refills | Status: DC

## 2016-12-25 NOTE — Progress Notes (Signed)
Patient ID: Chad Fox MRN: [id number], DOB: 11/01/1956, 61 y.o. Date of Encounter: 12/25/2016, 12:34 PM    Chief Complaint:  Chief Complaint  Patient presents with  . Cough      .      HPI: 61 y.o. year old male presents with above.   Says "everybody at work has had a cough and been on antibiotics---I guess it's my turn." States that he has been having a lot of cough. Has not had any body aches and no known fever. Has had no nasal congestion or mucus from the nose. No significant sore throat.     Home Meds:   Outpatient Medications Prior to Visit  Medication Sig Dispense Refill  . aspirin 81 MG tablet Take 81 mg by mouth daily.    Marland Kitchen GLUCOSAMINE HCL PO Take by mouth.    . Multiple Vitamin (MULTIVITAMIN) tablet Take 1 tablet by mouth daily.    . polyethylene glycol (MIRALAX / GLYCOLAX) packet Take 17 g by mouth daily.    Marland Kitchen azithromycin (ZITHROMAX) 250 MG tablet Take (2) tablets by mouth on day 1, then take (1) tablet by mouth on days 2-5. (Patient not taking: Reported on 09/24/2016) 6 tablet 0  . azithromycin (ZITHROMAX) 250 MG tablet Day 1: Take 2 daily. Days 2-5: take 1 daily. (Patient not taking: Reported on 12/25/2016) 6 tablet 0  . cetirizine (ZYRTEC) 10 MG tablet Take 1 tablet (10 mg total) by mouth daily. (Patient not taking: Reported on 12/25/2016) 30 tablet 0  . HYDROcodone-homatropine (HYCODAN) 5-1.5 MG/5ML syrup Take 5 mLs by mouth every 8 (eight) hours as needed for cough. (Patient not taking: Reported on 09/24/2016) 120 mL 0  . HYDROcodone-homatropine (HYCODAN) 5-1.5 MG/5ML syrup Take 5 mLs by mouth every 8 (eight) hours as needed for cough. (Patient not taking: Reported on 12/25/2016) 120 mL 0  . predniSONE (DELTASONE) 20 MG tablet Take 1 tablet (20 mg total) by mouth daily with breakfast. (Patient not taking: Reported on 12/25/2016) 5 tablet 0   No facility-administered medications prior to visit.     Allergies: No Known Allergies    Review of Systems:  See HPI for pertinent ROS. All other ROS negative.    Physical Exam: Blood pressure 130/78, pulse 87, temperature 98.8 F (37.1 C), temperature source Oral, resp. rate 16, weight 221 lb 9.6 oz (100.5 kg), SpO2 98 %., Body mass index is 30.05 kg/m. General: WNWD WM.  Appears in no acute distress. HEENT: Normocephalic, atraumatic, eyes without discharge, sclera non-icteric, nares are without discharge. Bilateral auditory canals clear, TM's are without perforation, pearly grey and translucent with reflective cone of light bilaterally. Oral cavity moist, posterior pharynx without exudate, erythema, peritonsillar abscess.  Neck: Supple. No thyromegaly. No lymphadenopathy. Lungs: Clear bilaterally to auscultation without wheezes, rales, or rhonchi. Breathing is unlabored. Heart: Regular rhythm. No murmurs, rubs, or gallops. Msk:  Strength and tone normal for age. Extremities/Skin: Warm and dry.  Neuro: Alert and oriented X 3. Moves all extremities spontaneously. Gait is normal. CNII-XII grossly in tact. Psych:  Responds to questions appropriately with a normal affect.     ASSESSMENT AND PLAN:  61 y.o. year old male with  1. Bacterial respiratory infection He states that he already has prescription cough suppressant that I prescribed at prior illness. Can use this as needed for cough suppressant -- especially at night for sleep. To take antibiotic as directed. Follow-up if symptoms do not resolve within 1 week after completion of antibiotic. -  azithromycin (ZITHROMAX) 250 MG tablet; Day 1: Take 2 daily. Days 2 -5: Take 1 daily.  Dispense: 6 tablet; Refill: 0   Signed, [address] [place]ixon, [place], [facility] 12/25/2016 12:34 PM

## 2016-12-26 ENCOUNTER — Other Ambulatory Visit: Payer: Self-pay | Admitting: Family Medicine

## 2016-12-26 DIAGNOSIS — Z79899 Other long term (current) drug therapy: Secondary | ICD-10-CM

## 2016-12-26 DIAGNOSIS — Z125 Encounter for screening for malignant neoplasm of prostate: Secondary | ICD-10-CM

## 2016-12-26 DIAGNOSIS — Z0000 Encounter for general adult medical examination without abnormal findings: Secondary | ICD-10-CM

## 2016-12-30 ENCOUNTER — Other Ambulatory Visit: Payer: BLUE CROSS/BLUE SHIELD

## 2016-12-30 DIAGNOSIS — Z0000 Encounter for general adult medical examination without abnormal findings: Secondary | ICD-10-CM

## 2016-12-30 DIAGNOSIS — Z125 Encounter for screening for malignant neoplasm of prostate: Secondary | ICD-10-CM | POA: Diagnosis not present

## 2016-12-30 DIAGNOSIS — Z79899 Other long term (current) drug therapy: Secondary | ICD-10-CM

## 2016-12-30 LAB — CBC WITH DIFFERENTIAL/PLATELET
Basophils Absolute: 42 cells/uL (ref 0–200)
Basophils Relative: 1 %
EOS PCT: 3 %
Eosinophils Absolute: 126 cells/uL (ref 15–500)
HCT: 42.9 % (ref 38.5–50.0)
Hemoglobin: 14.8 g/dL (ref 13.0–17.0)
LYMPHS PCT: 40 %
Lymphs Abs: 1680 cells/uL (ref 850–3900)
MCH: 31.8 pg (ref 27.0–33.0)
MCHC: 34.5 g/dL (ref 32.0–36.0)
MCV: 92.1 fL (ref 80.0–100.0)
MONOS PCT: 10 %
MPV: 9.9 fL (ref 7.5–12.5)
Monocytes Absolute: 420 cells/uL (ref 200–950)
NEUTROS PCT: 46 %
Neutro Abs: 1932 cells/uL (ref 1500–7800)
Platelets: 160 10*3/uL (ref 140–400)
RBC: 4.66 MIL/uL (ref 4.20–5.80)
RDW: 12.9 % (ref 11.0–15.0)
WBC: 4.2 10*3/uL (ref 3.8–10.8)

## 2016-12-30 LAB — LIPID PANEL
CHOLESTEROL: 153 mg/dL (ref <200)
HDL: 33 mg/dL — ABNORMAL LOW (ref >40)
LDL Cholesterol: 102 mg/dL — ABNORMAL HIGH (ref <100)
TRIGLYCERIDES: 92 mg/dL (ref <150)
Total CHOL/HDL Ratio: 4.6 Ratio (ref <5.0)
VLDL: 18 mg/dL (ref <30)

## 2016-12-30 LAB — COMPLETE METABOLIC PANEL WITH GFR
ALBUMIN: 4.4 g/dL (ref 3.6–5.1)
ALT: 17 U/L (ref 9–46)
AST: 19 U/L (ref 10–35)
Alkaline Phosphatase: 59 U/L (ref 40–115)
BUN: 18 mg/dL (ref 7–25)
CALCIUM: 9.2 mg/dL (ref 8.6–10.3)
CO2: 27 mmol/L (ref 20–31)
CREATININE: 0.84 mg/dL (ref 0.70–1.25)
Chloride: 107 mmol/L (ref 98–110)
GFR, Est African American: >89 mL/min (ref >=60)
GFR, Est Non African American: >89 mL/min (ref >=60)
GLUCOSE: 113 mg/dL — AB (ref 70–99)
Potassium: 4.5 mmol/L (ref 3.5–5.3)
SODIUM: 142 mmol/L (ref 135–146)
Total Bilirubin: 0.5 mg/dL (ref 0.2–1.2)
Total Protein: 6.9 g/dL (ref 6.1–8.1)

## 2016-12-30 LAB — PSA: PSA: 0.6 ng/mL (ref <=4.0)

## 2016-12-30 LAB — TSH: TSH: 2.51 mIU/L (ref 0.40–4.50)

## 2017-01-01 ENCOUNTER — Ambulatory Visit (INDEPENDENT_AMBULATORY_CARE_PROVIDER_SITE_OTHER): Payer: BLUE CROSS/BLUE SHIELD | Admitting: Physician Assistant

## 2017-01-01 ENCOUNTER — Encounter: Payer: Self-pay | Admitting: Physician Assistant

## 2017-01-01 VITALS — BP 130/80 | HR 83 | Temp 98.0°F | Resp 16 | Wt 222.2 lb

## 2017-01-01 DIAGNOSIS — R739 Hyperglycemia, unspecified: Secondary | ICD-10-CM | POA: Diagnosis not present

## 2017-01-01 DIAGNOSIS — Z23 Encounter for immunization: Secondary | ICD-10-CM

## 2017-01-01 DIAGNOSIS — Z0000 Encounter for general adult medical examination without abnormal findings: Secondary | ICD-10-CM | POA: Diagnosis not present

## 2017-01-01 DIAGNOSIS — K5909 Other constipation: Secondary | ICD-10-CM | POA: Diagnosis not present

## 2017-01-01 NOTE — Progress Notes (Signed)
Patient ID: Chad Fox MRN: [id number], DOB: [birth year] 61 y.o. Date of Encounter: 01/01/2017, 8:33 AM    Chief Complaint: Physical (CPE)  HPI: 61 y.o. y/owhite male here for CPE.   He has no specific complaints or concerns today. He came yesterday for fasting lab work.   Review of Systems: Consitutional: No fever, chills, fatigue, night sweats, lymphadenopathy, or weight changes. Eyes: No visual changes, eye redness, or discharge. ENT/Mouth: Ears: No otalgia, tinnitus, hearing loss, discharge. Nose: No congestion, rhinorrhea, sinus pain, or epistaxis. Throat: No sore throat, post nasal drip, or teeth pain. Cardiovascular: No CP, palpitations, diaphoresis, DOE, edema, orthopnea, PND. Respiratory: No cough, hemoptysis, SOB, or wheezing. Gastrointestinal: No anorexia, dysphagia, reflux, pain, nausea, vomiting, hematemesis, diarrhea, constipation, BRBPR, or melena. Genitourinary: No dysuria, frequency, urgency, hematuria, incontinence, nocturia, decreased urinary stream, discharge, impotence, or testicular pain/masses. Musculoskeletal: No decreased ROM, myalgias, stiffness, joint swelling, or weakness. Skin: No rash, erythema, lesion changes, pain, warmth, jaundice, or pruritis. Neurological: No headache, dizziness, syncope, seizures, tremors, memory loss, coordination problems, or paresthesias. Psychological: No anxiety, depression, hallucinations, SI/HI. Endocrine: No fatigue, polydipsia, polyphagia, polyuria, or known diabetes. All other systems were reviewed and are otherwise negative.  Past Medical History:  Diagnosis Date  . Chronic constipation      Past Surgical History:  Procedure Laterality Date  . INGUINAL HERNIA REPAIR  06/25/2012   left  . MOUTH SURGERY  2008    Home Meds:  Outpatient Medications Prior to Visit  Medication Sig Dispense Refill  . aspirin 81 MG tablet Take 81 mg by mouth daily.    Marland Kitchen GLUCOSAMINE HCL PO Take by mouth.    . Multiple Vitamin  (MULTIVITAMIN) tablet Take 1 tablet by mouth daily.    . polyethylene glycol (MIRALAX / GLYCOLAX) packet Take 17 g by mouth daily.    Marland Kitchen azithromycin (ZITHROMAX) 250 MG tablet Take (2) tablets by mouth on day 1, then take (1) tablet by mouth on days 2-5. (Patient not taking: Reported on 09/24/2016) 6 tablet 0  . azithromycin (ZITHROMAX) 250 MG tablet Day 1: Take 2 daily. Days 2-5: take 1 daily. (Patient not taking: Reported on 12/25/2016) 6 tablet 0  . azithromycin (ZITHROMAX) 250 MG tablet Day 1: Take 2 daily. Days 2 -5: Take 1 daily. (Patient not taking: Reported on 01/01/2017) 6 tablet 0  . cetirizine (ZYRTEC) 10 MG tablet Take 1 tablet (10 mg total) by mouth daily. (Patient not taking: Reported on 12/25/2016) 30 tablet 0  . HYDROcodone-homatropine (HYCODAN) 5-1.5 MG/5ML syrup Take 5 mLs by mouth every 8 (eight) hours as needed for cough. (Patient not taking: Reported on 09/24/2016) 120 mL 0  . HYDROcodone-homatropine (HYCODAN) 5-1.5 MG/5ML syrup Take 5 mLs by mouth every 8 (eight) hours as needed for cough. (Patient not taking: Reported on 12/25/2016) 120 mL 0  . predniSONE (DELTASONE) 20 MG tablet Take 1 tablet (20 mg total) by mouth daily with breakfast. (Patient not taking: Reported on 01/01/2017) 5 tablet 0   No facility-administered medications prior to visit.     Allergies: No Known Allergies  Social History   Social History  . Marital status: Married    Spouse name: N/A  . Number of children: N/A  . Years of education: N/A   Occupational History  . Not on file.   Social History Main Topics  . Smoking status: Former Smoker    Quit date: 06/11/1980  . Smokeless tobacco: Never Used  . Alcohol use No  . Drug use:  No  . Sexual activity: Not on file   Other Topics Concern  . Not on file   Social History Narrative   Works in [employer]    Married. 1 child. 3 grandchildren.    No exercise.    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     Family History  Problem Relation  Age of Onset  . Glaucoma Mother   . Hyperlipidemia Father     Physical Exam: Blood pressure 130/80, pulse 83, temperature 98 F (36.7 C), temperature source Oral, resp. rate 16, weight 222 lb 3.2 oz (100.8 kg), SpO2 98 %.  General: Well developed, well nourished, WM. Appears in no acute distress. HEENT: Normocephalic, atraumatic. Conjunctiva pink, sclera non-icteric. Pupils 2 mm constricting to 1 mm, round, regular, and equally reactive to light and accomodation. EOMI. Internal auditory canal clear. TMs with good cone of light and without pathology. Nasal mucosa pink. Nares are without discharge. No sinus tenderness. Oral mucosa pink. Dentition normal. Pharynx without exudate.   Neck: Supple. Trachea midline. No thyromegaly. Full ROM. No lymphadenopathy. Lungs: Clear to auscultation bilaterally without wheezes, rales, or rhonchi. Breathing is of normal effort and unlabored. Cardiovascular: RRR with S1 S2. No murmurs, rubs, or gallops. Distal pulses 2+ symmetrically. No carotid or abdominal bruits. Abdomen: Soft, non-tender, non-distended with normoactive bowel sounds. No hepatosplenomegaly or masses. No rebound/guarding. No CVA tenderness. No hernias. Rectal: Deferred. Musculoskeletal: Full range of motion and 5/5 strength throughout. Without swelling, atrophy, tenderness, crepitus, or warmth. Extremities without clubbing, cyanosis, or edema.. Skin: Warm and moist without erythema, ecchymosis, wounds, or rash. Neuro: A+Ox3. CN II-XII grossly intact. Moves all extremities spontaneously. Full sensation throughout. Normal gait.  Psych:  Responds to questions appropriately with a normal affect.   Assessment/Plan:  61 y.o. y/o  white male here for CPE -1. Visit for preventive health examination  A. Screening Labs: He came for fasting labs yesterday. CMET is normal except for glucose 113. Hemoglobin A1c has been added. TSH normal. Lipid panel is good. LDL 102. CBC is normal PSA normal  B.  Screening For Prostate Cancer: PSA Normal.   C. Screening For Colorectal Cancer:  He had colonoscopy by Dr. Leary Roca 01/08/2006. Normal. Repeat 10 years. 01/01/2017---Discussed that he is overdue for follow-up colonoscopy. He reports that he has known multiple people recently who had complications with their colonoscopies including perforations.  Discusses he has no family history of cancer, he is asymptomatic . He is really concerned about doing a colonoscopy right now. Discussed that colonoscopy is the best screening method for detecting colon cancer but that there are other methods---discussed other screening methods including Hemoccult cards and cologuard. He is agreeable to do cologuard and will proceed with cologuard now.  D. Immunizations: Flu--I discussed influenza vaccine and recommended it but he defers. Tetanus--he received tetanus vaccine here 2007.        01/01/2017-- Recommend that he update this today and he is agreeable. T dap given here 01/01/2017 Pneumococcal--he has no indication to need pneumonia vaccine until age 61. Zostavax-----he states that he received Zostavax 07/2013.     2. Chronic constipation He says that he uses one capful of MiraLAX daily. Says that this works well for him. Offered trying Linzess which would be a pill that he could take but he says that his current treatment is working fine and he does not want to change. 01/01/2017:  Reviewed that I had discussed Linzess at visit back in December 2015 and at that time he  wanted to continue Miralax. Again today he says that the Miralax is working just fine and it does not bother him at all to mix it into his coffee in the morning so he will just continue this and defers Rx medicine.  3. Hyperglycemia 01/01/2017: Add Hgb A1C   I will follow-up with patient once I get the hemoglobin A1c results. If he needs follow-up regarding this, then  we will tell him at that time. Otherwise can wait one year for for routine  follow-up.   Signed:   [address] Niota[place]ixon,PA, [place]FM  01/01/2017 8:33 AM

## 2017-01-03 ENCOUNTER — Telehealth: Payer: Self-pay

## 2017-01-03 DIAGNOSIS — Z125 Encounter for screening for malignant neoplasm of prostate: Secondary | ICD-10-CM

## 2017-01-03 NOTE — Telephone Encounter (Signed)
He will probably have to have Referral. Place order for Referral--Note in referral that [facility] Inform pt

## 2017-01-03 NOTE — Addendum Note (Signed)
Addended by: Phineas SemenJOHNSON, Lacreasha Hinds A on: 01/03/2017 12:48 PM   Modules accepted: Orders

## 2017-01-03 NOTE — Telephone Encounter (Signed)
Patient states his insurance will not cover the cologuard so he will not be sending in a sample. Patient states he will call and schedule to have a [facility]

## 2017-01-17 ENCOUNTER — Telehealth: Payer: Self-pay

## 2017-01-17 NOTE — Telephone Encounter (Signed)
Patient called and states he will have his colon[facility]

## 2017-02-18 DIAGNOSIS — H2513 Age-related nuclear cataract, bilateral: Secondary | ICD-10-CM | POA: Diagnosis not present

## 2017-02-28 DIAGNOSIS — Z1211 Encounter for screening for malignant neoplasm of colon: Secondary | ICD-10-CM | POA: Diagnosis not present

## 2017-07-28 ENCOUNTER — Encounter: Payer: Self-pay | Admitting: Family Medicine

## 2017-07-28 ENCOUNTER — Ambulatory Visit (INDEPENDENT_AMBULATORY_CARE_PROVIDER_SITE_OTHER): Payer: BLUE CROSS/BLUE SHIELD | Admitting: Family Medicine

## 2017-07-28 VITALS — BP 124/76 | HR 68 | Temp 97.9°F | Resp 18 | Wt 222.6 lb

## 2017-07-28 DIAGNOSIS — H8110 Benign paroxysmal vertigo, unspecified ear: Secondary | ICD-10-CM

## 2017-07-28 MED ORDER — MECLIZINE HCL 25 MG PO TABS: 25.0000 mg | ORAL_TABLET | Freq: Three times a day (TID) | ORAL | 0 refills | Status: DC | PRN

## 2017-07-28 NOTE — Patient Instructions (Signed)
Take three times a day for1- 2 days, then taper down  Call if this reoccurs  F/U as needed

## 2017-07-28 NOTE — Progress Notes (Signed)
   Subjective:    Patient ID: Chad Fox, male    DOB: 1956-08-02, 61 y.o.   MRN: [id number]  Patient presents for dizziness (x 2 weeks)  Patient here with dizziness for the past 2 weeks. States he was laying on the floor putting together her desk he rolled over to pick up some screws and everything started spinning he had nausea and vomiting directly after her Sweat. The severe spinning lasted for about 45 minutes. He was able to drive himself home but when he would turn his head certain directions it would come back on him. It seems like it would ease up some days but if he bent over or turns his head quickly in the bed spinning would come back. He did try Dramamine but this did not help. He has not had any further vomiting no chest pain or shortness of breath. States that he felt like he is drugged.  He does not take any prescription medications   Review Of Systems:  GEN- denies fatigue, fever, weight loss,weakness, recent illness HEENT- denies eye drainage, change in vision, nasal discharge, CVS- denies chest pain, palpitations RESP- denies SOB, cough, wheeze ABD- denies N/V, change in stools, abd pain GU- denies dysuria, hematuria, dribbling, incontinence MSK- denies joint pain, muscle aches, injury Neuro- denies headache, +dizziness, syncope, seizure activity       Objective:    BP 124/76 (BP Location: Right Arm, Patient Position: Sitting, Cuff Size: Large)   Pulse 68   Temp 97.9 F (36.6 C) (Oral)   Resp 18   Wt 222 lb 9.6 oz (101 kg)   BMI 30.19 kg/m  GEN- NAD, alert and oriented x3 HEENT- PERRL, EOMI, non injected sclera, pink conjunctiva, MMM, oropharynx clear Neck- Supple, no bruit  CVS- RRR, no murmur RESP-CTAB Neuro-CNII-XII intact,no nystagmus, no focal deficits  EXT- No edema Pulses- Radial, 2+        Assessment & Plan:      Problem List Items Addressed This Visit    None    Visit Diagnoses    Benign paroxysmal positional vertigo, unspecified  laterality    -  Primary   Meclizine taper given, symptoms have improved, no neuroglocal deficits or cardiac issues noted. Call if not improved in 1 week      Note: This dictation was prepared with Dragon dictation along with smaller phrase technology. Any transcriptional errors that result from this process are unintentional.

## 2018-11-17 ENCOUNTER — Ambulatory Visit (HOSPITAL_COMMUNITY)
Admission: RE | Admit: 2018-11-17 | Discharge: 2018-11-17 | Disposition: A | Discharge status: Home / Self Care | Payer: BLUE CROSS/BLUE SHIELD | Source: Ambulatory Visit | Attending: Family Medicine | Admitting: Family Medicine

## 2018-11-17 ENCOUNTER — Encounter: Payer: Self-pay | Admitting: Family Medicine

## 2018-11-17 ENCOUNTER — Ambulatory Visit: Payer: BLUE CROSS/BLUE SHIELD | Admitting: Family Medicine

## 2018-11-17 VITALS — BP 120/80 | HR 91 | Temp 97.9°F | Resp 15 | Ht 72.0 in | Wt 226.1 lb

## 2018-11-17 DIAGNOSIS — J069 Acute upper respiratory infection, unspecified: Secondary | ICD-10-CM | POA: Diagnosis not present

## 2018-11-17 DIAGNOSIS — R062 Wheezing: Secondary | ICD-10-CM | POA: Insufficient documentation

## 2018-11-17 DIAGNOSIS — R05 Cough: Secondary | ICD-10-CM | POA: Insufficient documentation

## 2018-11-17 DIAGNOSIS — R059 Cough, unspecified: Secondary | ICD-10-CM

## 2018-11-17 MED ORDER — PREDNISONE 20 MG PO TABS: ORAL_TABLET | ORAL | 0 refills | Status: DC

## 2018-11-17 MED ORDER — BENZONATATE 100 MG PO CAPS: 100.0000 mg | ORAL_CAPSULE | Freq: Three times a day (TID) | ORAL | 0 refills | Status: AC | PRN

## 2018-11-17 MED ORDER — ALBUTEROL SULFATE HFA 108 (90 BASE) MCG/ACT IN AERS: 2.0000 | INHALATION_SPRAY | RESPIRATORY_TRACT | 0 refills | Status: AC | PRN

## 2018-11-17 MED ORDER — IPRATROPIUM-ALBUTEROL 0.5-2.5 (3) MG/3ML IN SOLN
3.0000 mL | Freq: Once | RESPIRATORY_TRACT | Status: AC
  Administered 2018-11-17: 3 mL via RESPIRATORY_TRACT

## 2018-11-17 MED ORDER — GUAIFENESIN ER 600 MG PO TB12: 600.0000 mg | ORAL_TABLET | Freq: Two times a day (BID) | ORAL | 0 refills | Status: AC

## 2018-11-17 NOTE — Progress Notes (Signed)
Patient ID: Chad Fox, male    DOB: 1956/02/01, 63 y.o.   MRN: [id number]  PCP: Dorena Bodo, PA-C  Chief Complaint  Patient presents with  . Cough    Patient in today with c/o productive cough, nasal congestion, chest congestion. Onset a few days ago    Subjective:   Chad Fox is a 63 y.o. male, presents to clinic with CC of URI sx and cough. URI/cough, feels congested head and chest, started 4-5 days ago started, sx worse on Saturday, he has stayed home in bed for the past 4 days.    URI   This is a new problem. The current episode started in the past 7 days. The problem has been gradually worsening. There has been no fever. Associated symptoms include congestion, coughing (green sputum), headaches, rhinorrhea, a sore throat and wheezing ("sometimes a little bit"  wife heard wheezing). Pertinent negatives include no abdominal pain, chest pain, diarrhea, dysuria, ear pain, joint pain, joint swelling, nausea, neck pain, plugged ear sensation, rash, sinus pain, sneezing, swollen glands or vomiting. Treatments tried: vicks, dayquil and nyquil. The treatment provided no relief.   Co-worker had flu, but has been out for over a week.  Out of work yesterday and today.  States he's stayed in bed 100% for four days, but he does deny sweats, fatigue, fevers, chest pain, shortness of breath, nausea, change to appetite.  He says he gets poor sleep regularly and there is been no change to his normal.    He reports having pneumonia 30 years ago, does not get bronchitis frequently but earlier this year he did have some sinus infection and coughing.  He says hes never used inhaler.  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 Hyperglycemia 01/01/2017  . DJD (degenerative joint disease) 11/29/2014  . Chronic constipation      Prior to Admission  medications   Medication Sig Start Date End Date Taking? Authorizing Provider  aspirin 81 MG tablet Take 81 mg by mouth daily.   Yes [provider]  GLUCOSAMINE HCL PO Take by mouth.   Yes [provider]  Multiple Vitamin (MULTIVITAMIN) tablet Take 1 tablet by mouth daily.   Yes [provider]  polyethylene glycol (MIRALAX / GLYCOLAX) packet Take 17 g by mouth daily.   Yes [provider]  meclizine (ANTIVERT) 25 MG tablet Take 1 tablet (25 mg total) by mouth 3 (three) times daily as needed for dizziness. Patient not taking: Reported on 11/17/2018 07/28/17   Salley Scarlet, MD     No Known Allergies   Family History  Problem Relation Age of Onset  . Glaucoma Mother   . Hyperlipidemia Father      Social History   Socioeconomic History  . Marital status: Married    Spouse name: Not on file  . Number of children: Not on file  . Years of education: Not on file  . Highest education level: Not on file  Occupational History  . Not on file  Social Needs  . Financial resource strain: Not on file  . Food insecurity:    Worry: Not on file    Inability: Not on file  . Transportation needs:    Medical: Not on file    Non-medical: Not on file  Tobacco Use  . Smoking status: Former  Smoker    Last attempt to quit: 06/11/1980    Years since quitting: 38.4  . Smokeless tobacco: Never Used  Substance and Sexual Activity  . Alcohol use: No    Alcohol/week: 0.0 standard drinks  . Drug use: No  . Sexual activity: Not on file  Lifestyle  . Physical activity:    Days per week: Not on file    Minutes per session: Not on file  . Stress: Not on file  Relationships  . Social connections:    Talks on phone: Not on file    Gets together: Not on file    Attends religious service: Not on file    Active member of club or organization: Not on file    Attends meetings of clubs or organizations: Not on file    Relationship status: Not on file  .  Intimate partner violence:    Fear of current or ex partner: Not on file    Emotionally abused: Not on file    Physically abused: Not on file    Forced sexual activity: Not on file  Other Topics Concern  . Not on file  Social History Narrative   Works in [employer]    Married. 1 child. 3 grandchildren.    No exercise.    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      Review of Systems  Constitutional: Positive for activity change.  HENT: Positive for congestion, rhinorrhea, sinus pressure and sore throat. Negative for ear pain, sinus pain, sneezing, trouble swallowing and voice change.   Respiratory: Positive for cough (green sputum) and wheezing ("sometimes a little bit"  wife heard wheezing). Negative for apnea, choking, chest tightness, shortness of breath and stridor.   Cardiovascular: Negative.  Negative for chest pain, palpitations and leg swelling.  Gastrointestinal: Negative.  Negative for abdominal pain, diarrhea, nausea and vomiting.  Genitourinary: Negative.  Negative for dysuria.  Musculoskeletal: Negative.  Negative for joint pain and neck pain.  Skin: Negative.  Negative for color change, pallor and rash.  Neurological: Positive for headaches. Negative for dizziness, tremors, syncope, facial asymmetry, weakness, light-headedness and numbness.  Hematological: Negative.   Psychiatric/Behavioral: Negative.        Objective:    Vitals:   11/17/18 1412  BP: 120/80  Pulse: 91  Resp: 15  Temp: 97.9 F (36.6 C)  TempSrc: Oral  SpO2: 97%  Weight: 226 lb 2 oz (102.6 kg)  Height: 6' (1.829 m)      Physical Exam Vitals signs and nursing note reviewed.  Constitutional:      General: He is not in acute distress.    Appearance: Normal appearance. He is well-developed. He is not toxic-appearing or diaphoretic.  HENT:     Head: Normocephalic and atraumatic.     Jaw: No trismus.     Right Ear: Tympanic membrane, ear canal and external ear normal.     Left  Ear: Tympanic membrane, ear canal and external ear normal.     Nose: Mucosal edema and rhinorrhea present.     Right Sinus: No maxillary sinus tenderness or frontal sinus tenderness.     Left Sinus: No maxillary sinus tenderness or frontal sinus tenderness.     Mouth/Throat:     Mouth: Mucous membranes are not pale, not dry and not cyanotic.     Pharynx: Uvula midline. Posterior oropharyngeal erythema present. No oropharyngeal exudate or uvula swelling.     Tonsils: No tonsillar exudate or tonsillar abscesses.  Eyes:  General: Lids are normal.        Right eye: No discharge.        Left eye: No discharge.     Conjunctiva/sclera: Conjunctivae normal.     Pupils: Pupils are equal, round, and reactive to light.  Neck:     Musculoskeletal: Normal range of motion and neck supple.     Trachea: Trachea and phonation normal. No tracheal deviation.  Cardiovascular:     Rate and Rhythm: Normal rate and regular rhythm.     Pulses:          Radial pulses are 2+ on the right side and 2+ on the left side.     Heart sounds: Normal heart sounds. No murmur. No friction rub. No gallop.   Pulmonary:     Effort: Pulmonary effort is normal. No tachypnea, accessory muscle usage or respiratory distress.     Breath sounds: Decreased air movement present. No stridor. Wheezing and rhonchi present. No decreased breath sounds or rales.  Abdominal:     General: Bowel sounds are normal. There is no distension.     Palpations: Abdomen is soft.     Tenderness: There is no abdominal tenderness.  Musculoskeletal: Normal range of motion.  Skin:    General: Skin is warm and dry.     Capillary Refill: Capillary refill takes less than 2 seconds.     Coloration: Skin is not pale.     Findings: No rash.     Nails: There is no clubbing.   Neurological:     Mental Status: He is alert and oriented to person, place, and time.     Motor: No abnormal muscle tone.     Coordination: Coordination normal.     Gait: Gait  normal.  Psychiatric:        Speech: Speech normal.        Behavior: Behavior normal. Behavior is cooperative.           Assessment & Plan:      ICD-10-CM   1. Upper respiratory tract infection, unspecified type J06.9 benzonatate (TESSALON) 100 MG capsule    guaiFENesin (MUCINEX) 600 MG 12 hr tablet    DG Chest 2 View   likely started with viral illness, tx supportive and symptomatic, f/up if acute worsening or prolonged severe sx  2. Wheeze R06.2 ipratropium-albuterol (DUONEB) 0.5-2.5 (3) MG/3ML nebulizer solution 3 mL    predniSONE (DELTASONE) 20 MG tablet    albuterol (VENTOLIN HFA) 108 (90 Base) MCG/ACT inhaler    DG Chest 2 View   severe, diffuse wheeze, some cleared with breathing tx, steroid injection in clinic, start steroid PO tomorrow.   3. Cough R05 benzonatate (TESSALON) 100 MG capsule    guaiFENesin (MUCINEX) 600 MG 12 hr tablet    DG Chest 2 View   suspect acute bronchitis - cxr to further eval     HR 90-110 after duoneb, SpO2 94%  Wheeze and rhonchi on right lung fields improved, left lung fields, all, persisted with inspiratory and expiratory wheeze and rhonchi.  tx for Acute bronchitis with Mucinex, albuterol inhaler, steroid burst, Tessalon for cough suppression, CXR to further evaluate - r/o CAP  Danelle BerryLeisa Anyeli Hockenbury, PA-C 11/17/18 2:44 PM

## 2018-11-17 NOTE — Patient Instructions (Signed)
Get chest X-ray today   Start steroids tomorrow morning  Start mucinex today with ample clear fluids (drink a lot)  Use inhaler and cough meds as needed - can also try robitussin or delsym.  Can go back to work if no fever and do not feel ill.  Suspect cough will linger for a while  I will call you with X-ray results and let you know if we need to add an antibiotic

## 2018-11-23 ENCOUNTER — Encounter: Payer: Self-pay | Admitting: Family Medicine

## 2019-01-25 ENCOUNTER — Other Ambulatory Visit: Payer: Self-pay

## 2019-01-25 DIAGNOSIS — J309 Allergic rhinitis, unspecified: Secondary | ICD-10-CM

## 2019-01-25 DIAGNOSIS — R0982 Postnasal drip: Principal | ICD-10-CM

## 2019-01-26 MED ORDER — CETIRIZINE HCL 10 MG PO TABS: 10.0000 mg | ORAL_TABLET | Freq: Every day | ORAL | 1 refills | Status: AC

## 2019-01-26 NOTE — Telephone Encounter (Signed)
Spoke with pt. Verbalizes understanding.

## 2019-01-26 NOTE — Telephone Encounter (Signed)
We can try and put a zyrtec order through, do 10 mg tablet, take one tablet PO daily, disp #90 with 1 refill for allergic rhinitis,  but please contact the pt and let him know insurance may not cover it.  Do not do any PA process.  Notify pt if insurance doesn't cover it he should get OTC zyrtec, allegra or claritin- can be as little as $4-5 dollars generic at [facility] or [facility].

## 2019-08-23 ENCOUNTER — Ambulatory Visit: Payer: Self-pay | Admitting: Family Medicine

## 2019-08-23 ENCOUNTER — Other Ambulatory Visit: Payer: Self-pay

## 2019-08-23 ENCOUNTER — Ambulatory Visit (INDEPENDENT_AMBULATORY_CARE_PROVIDER_SITE_OTHER): Payer: Medicare Other | Admitting: Family Medicine

## 2019-08-23 DIAGNOSIS — R05 Cough: Secondary | ICD-10-CM

## 2019-08-23 DIAGNOSIS — Z20828 Contact with and (suspected) exposure to other viral communicable diseases: Secondary | ICD-10-CM | POA: Diagnosis not present

## 2019-08-23 DIAGNOSIS — R058 Other specified cough: Secondary | ICD-10-CM

## 2019-08-23 DIAGNOSIS — Z20822 Contact with and (suspected) exposure to covid-19: Secondary | ICD-10-CM

## 2019-08-23 NOTE — Progress Notes (Signed)
Subjective:    Patient ID: Chad Fox, male    DOB: 1956-08-07, 63 y.o.   MRN: [id number]  HPI  Patient is being seen today as a telephone visit.  Phone call began at 1220.  Phone call concluded at 1231.  Patient was exposed to COVID-19 last Monday.  Thursday he went for a test.  His test was negative.  Friday he began experiencing a scratchy throat and a low-grade fever.  By Saturday he developed a aggravating cough, scratchy throat, and low-grade fever.  By Sunday his fever was up to 100-1 01.  He reports body aches, fever, scratchy throat, and nonproductive cough.  He denies any chest pain.  He denies any shortness of breath.  He denies any dyspnea on exertion.  He denies any pleurisy or hemoptysis.  He denies any purulent sputum. Past Medical History:  Diagnosis Date  . Chronic constipation    Past Surgical History:  Procedure Laterality Date  . INGUINAL HERNIA REPAIR  06/25/2012   left  . MOUTH SURGERY  2008   Current Outpatient Medications on File Prior to Visit  Medication Sig Dispense Refill  . albuterol (VENTOLIN HFA) 108 (90 Base) MCG/ACT inhaler Inhale 2 puffs into the lungs every 4 (four) hours as needed for wheezing or shortness of breath (cough/SOB/chest tightness). 1 Inhaler 0  . aspirin 81 MG tablet Take 81 mg by mouth daily.    . benzonatate (TESSALON) 100 MG capsule Take 1 capsule (100 mg total) by mouth 3 (three) times daily as needed for cough. 30 capsule 0  . cetirizine (ZYRTEC) 10 MG tablet Take 1 tablet (10 mg total) by mouth daily. 90 tablet 1  . GLUCOSAMINE HCL PO Take by mouth.    . meclizine (ANTIVERT) 25 MG tablet Take 1 tablet (25 mg total) by mouth 3 (three) times daily as needed for dizziness. (Patient not taking: Reported on 11/17/2018) 30 tablet 0  . Multiple Vitamin (MULTIVITAMIN) tablet Take 1 tablet by mouth daily.    . polyethylene glycol (MIRALAX / GLYCOLAX) packet Take 17 g by mouth daily.    . predniSONE (DELTASONE) 20 MG tablet 2 tabs poqday  1-3, 1 tabs poqday 4-6 9 tablet 0   No current facility-administered medications on file prior to visit.    No Known Allergies Social History   Socioeconomic History  . Marital status: Married    Spouse name: Not on file  . Number of children: Not on file  . Years of education: Not on file  . Highest education level: Not on file  Occupational History  . Not on file  Social Needs  . Financial resource strain: Not on file  . Food insecurity    Worry: Not on file    Inability: Not on file  . Transportation needs    Medical: Not on file    Non-medical: Not on file  Tobacco Use  . Smoking status: Former Smoker    Quit date: 06/11/1980    Years since quitting: 39.2  . Smokeless tobacco: Never Used  Substance and Sexual Activity  . Alcohol use: No    Alcohol/week: 0.0 standard drinks  . Drug use: No  . Sexual activity: Not on file  Lifestyle  . Physical activity    Days per week: Not on file    Minutes per session: Not on file  . Stress: Not on file  Relationships  . Social Musician on phone: Not on file    Gets  together: Not on file    Attends religious service: Not on file    Active member of club or organization: Not on file    Attends meetings of clubs or organizations: Not on file    Relationship status: Not on file  . Intimate partner violence    Fear of current or ex partner: Not on file    Emotionally abused: Not on file    Physically abused: Not on file    Forced sexual activity: Not on file  Other Topics Concern  . Not on file  Social History Narrative   Works in [employer]    Married. 1 child. 3 grandchildren.    No exercise.    Yard work, Environmental consultant once a week.      Review of Systems  All other systems reviewed and are negative.      Objective:   Physical Exam  Physical exam cannot be performed today as patient was seen as a telephone visit      Assessment & Plan:  Cough with exposure to COVID-19 virus  I  suspect that the patient has COVID-19.  I suspect that he was tested too early in the disease process and that is why his test returned negative.  The other possibility would be a viral upper respiratory infection.  I have recommended that he get retested.  He prefers to go to [facility] and be tested.  I have recommended quarantine for 10 days from symptom onset.  Symptoms began on Friday so he will be on quarantine until next Wednesday.  Also recommended that his wife be quarantine for 14 days.  Recommended symptomatic care only.  Seek medical attention immediately if he develops shortness of breath or chest pain or purulent sputum

## 2019-08-26 ENCOUNTER — Telehealth: Payer: Self-pay | Admitting: Family Medicine

## 2019-08-26 NOTE — Telephone Encounter (Signed)
Patient called in stating that he received the results to his COVID test that he had on Monday 08/23/2019 and results were positive. Patient states that he has not ran a fever since this Tuesday on 08/13/2019. Advised patient that per CDC guidelines he has to stay quarantined 10 days after the onset of symptoms if he test positive so that COVID could not be transmittable. Patient states that he can not stay home for 10 days and feels that he should be able to return to the public since he has not had a fever in 48 hrs. I advised that though no symptoms are present he is still contagious and should not return to normal activities until next week. Patient verbalized understanding.

## 2020-02-13 IMAGING — DX DG CHEST 2V
2 series · 2 of 2 positions shown · non-contrast
Comparison: None.

CLINICAL DATA: Cough for several days

EXAM:
CHEST - 2 VIEW

[chest pa]
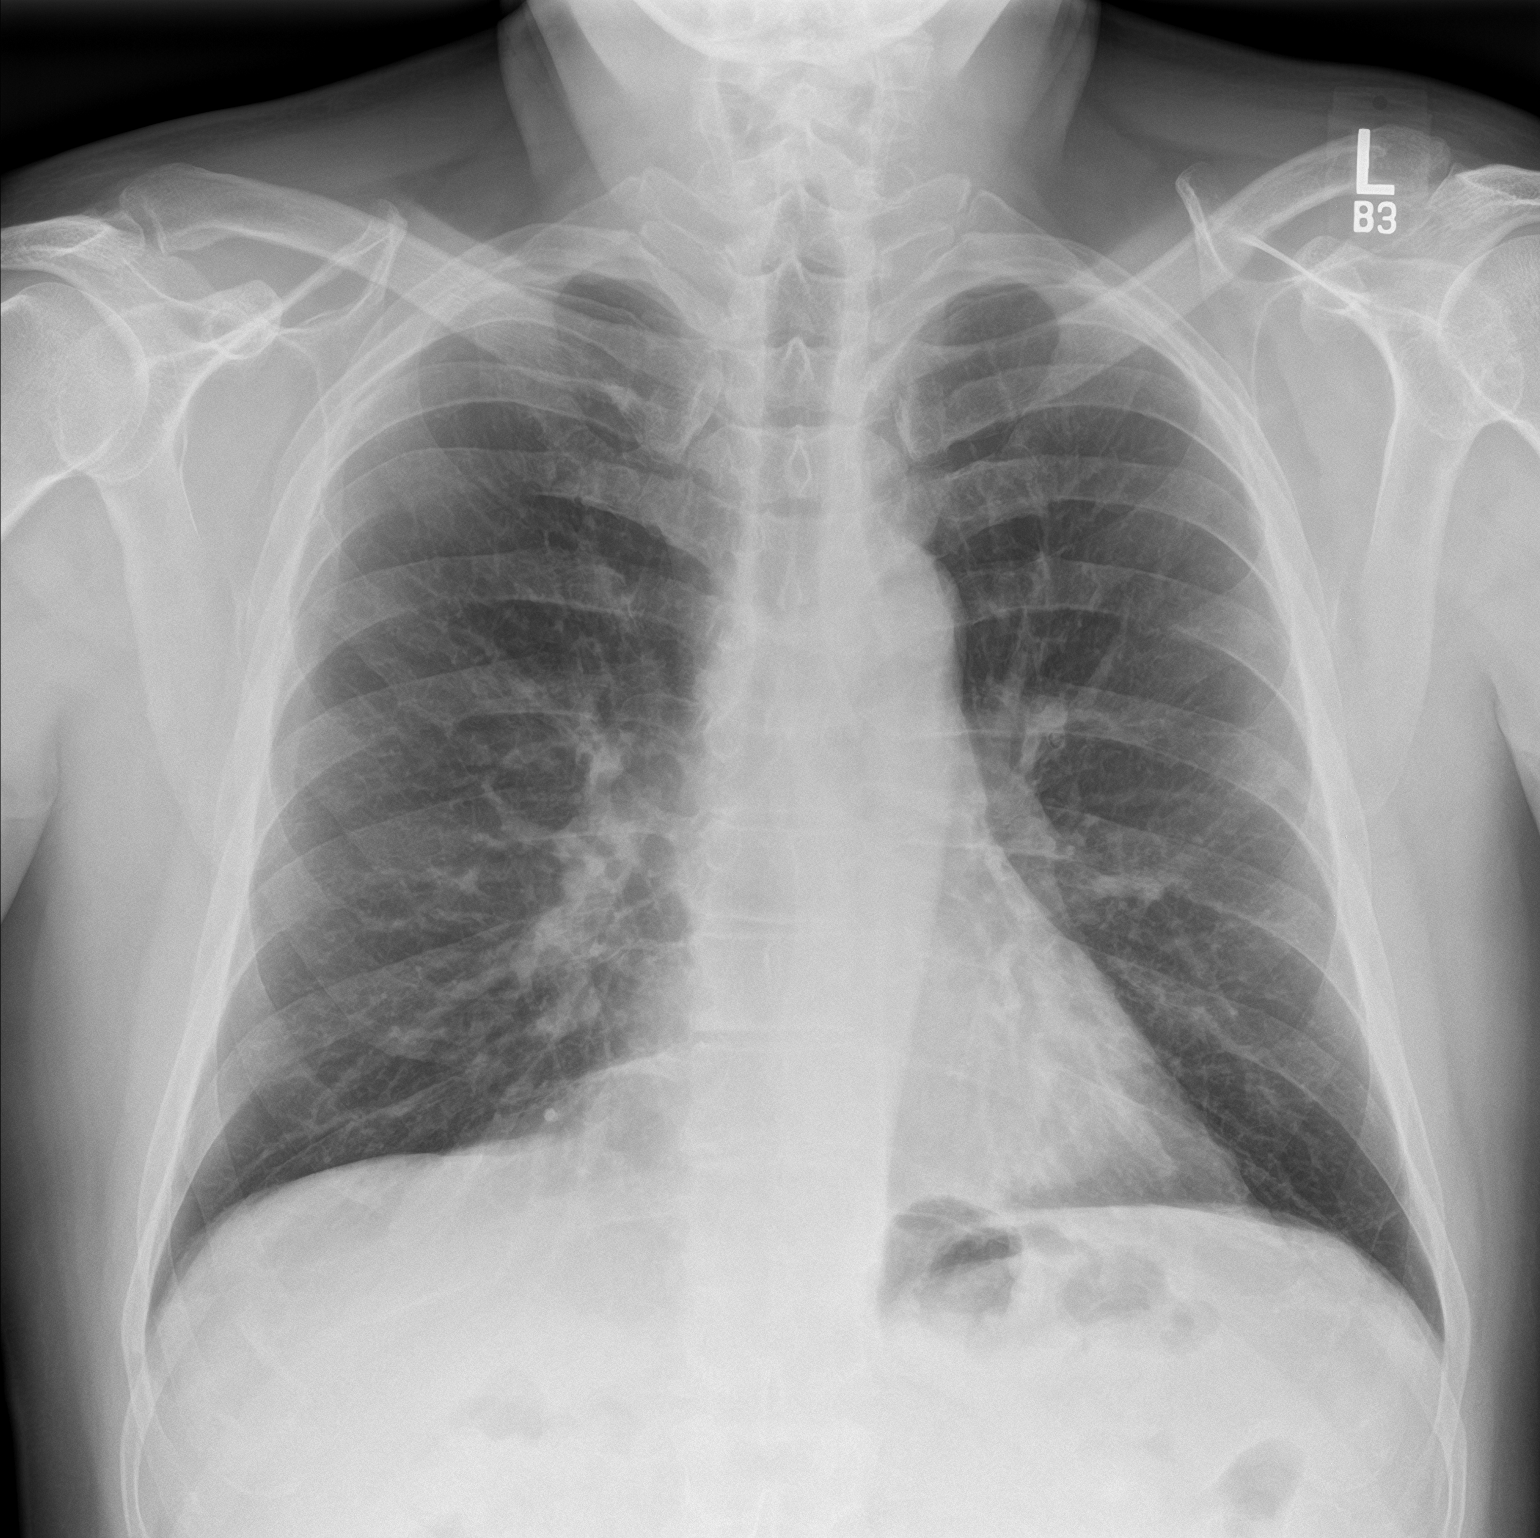

[chest lat]
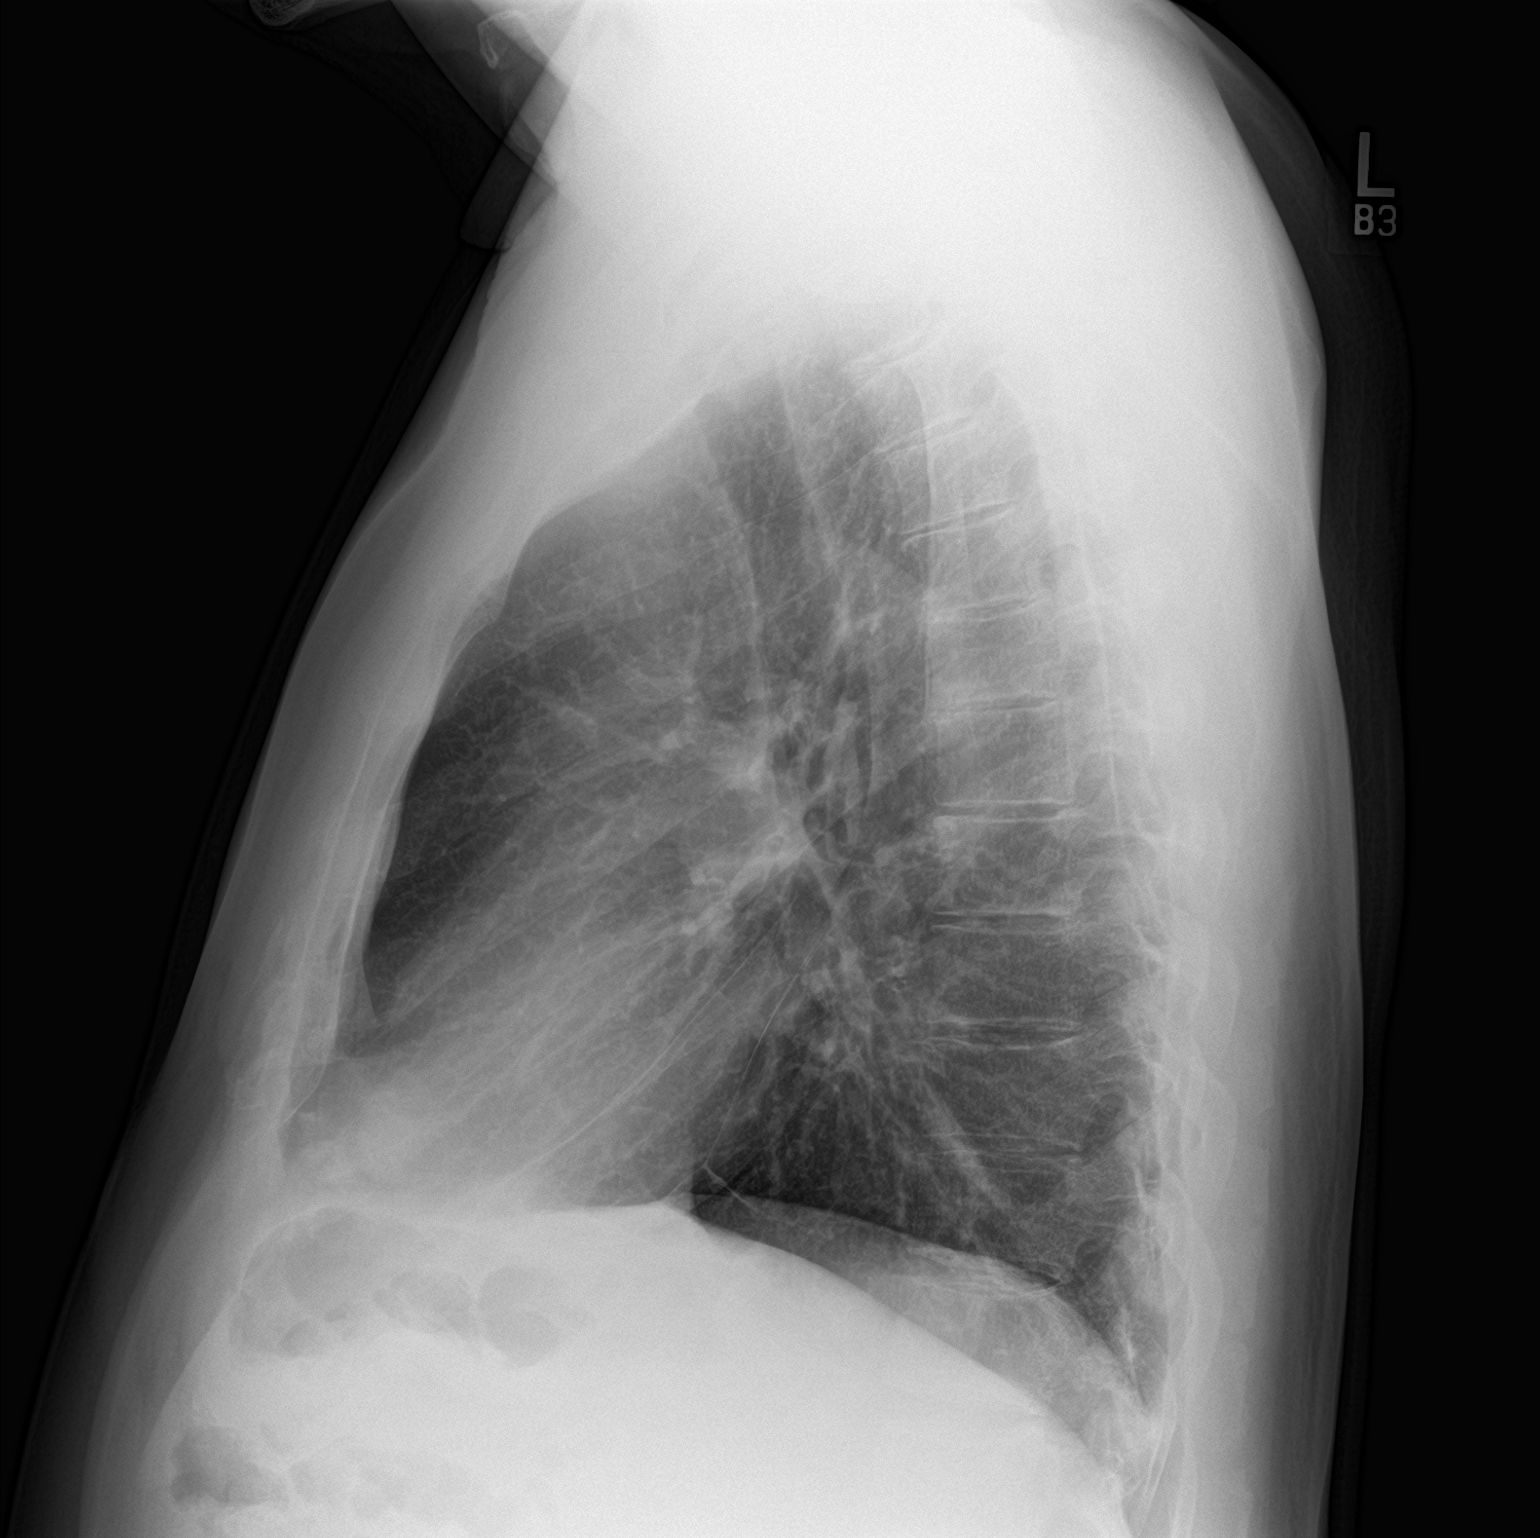

[2 of 2 positions shown; findings below may reference images not displayed]

FINDINGS: Cardiac shadows within normal limits. The lungs are well aerated
bilaterally. No focal infiltrate or sizable effusion is seen. No
acute bony abnormality is noted.
IMPRESSION: No acute abnormality seen.

## 2020-04-12 ENCOUNTER — Other Ambulatory Visit: Payer: Self-pay | Admitting: Family Medicine

## 2020-07-14 ENCOUNTER — Other Ambulatory Visit: Payer: Self-pay

## 2020-07-14 ENCOUNTER — Other Ambulatory Visit: Payer: Medicare Other

## 2020-07-14 DIAGNOSIS — Z0000 Encounter for general adult medical examination without abnormal findings: Secondary | ICD-10-CM

## 2020-07-14 DIAGNOSIS — Z1329 Encounter for screening for other suspected endocrine disorder: Secondary | ICD-10-CM

## 2020-07-14 DIAGNOSIS — Z125 Encounter for screening for malignant neoplasm of prostate: Secondary | ICD-10-CM

## 2020-07-14 DIAGNOSIS — Z13220 Encounter for screening for lipoid disorders: Secondary | ICD-10-CM

## 2020-07-14 DIAGNOSIS — K5909 Other constipation: Secondary | ICD-10-CM

## 2020-07-18 ENCOUNTER — Ambulatory Visit (INDEPENDENT_AMBULATORY_CARE_PROVIDER_SITE_OTHER): Payer: Medicare Other | Admitting: Family Medicine

## 2020-07-18 ENCOUNTER — Other Ambulatory Visit: Payer: Self-pay

## 2020-07-18 ENCOUNTER — Encounter: Payer: Self-pay | Admitting: Family Medicine

## 2020-07-18 VITALS — BP 158/88 | HR 67 | Resp 16 | Ht 72.0 in | Wt 218.0 lb

## 2020-07-18 DIAGNOSIS — E7800 Pure hypercholesterolemia, unspecified: Secondary | ICD-10-CM

## 2020-07-18 DIAGNOSIS — Z23 Encounter for immunization: Secondary | ICD-10-CM | POA: Diagnosis not present

## 2020-07-18 DIAGNOSIS — R030 Elevated blood-pressure reading, without diagnosis of hypertension: Secondary | ICD-10-CM

## 2020-07-18 DIAGNOSIS — R7303 Prediabetes: Secondary | ICD-10-CM

## 2020-07-18 DIAGNOSIS — Z0001 Encounter for general adult medical examination with abnormal findings: Secondary | ICD-10-CM

## 2020-07-18 DIAGNOSIS — Z0000 Encounter for general adult medical examination without abnormal findings: Secondary | ICD-10-CM

## 2020-07-18 MED ORDER — ROSUVASTATIN CALCIUM 10 MG PO TABS: 10.0000 mg | ORAL_TABLET | Freq: Every day | ORAL | 3 refills | Status: AC

## 2020-07-18 NOTE — Progress Notes (Signed)
Subjective:    Patient ID: Chad Fox, male    DOB: 07-03-1956, 64 y.o.   MRN: [id number]  HPI  Patient was previously seen my partner.  His most recent lab work is listed below: Lab on 07/14/2020  Component Date Value Ref Range Status  . Cholesterol 07/14/2020 189  <200 mg/dL Final  . HDL 38/75/6433 39* > OR = 40 mg/dL Final  . Triglycerides 07/14/2020 154* <150 mg/dL Final  . LDL Cholesterol (Calc) 07/14/2020 123* mg/dL (calc) Final   Comment: Reference range: <100 . Desirable range <100 mg/dL for primary prevention;   <70 mg/dL for patients with CHD or diabetic patients  with > or = 2 CHD risk factors. Marland Kitchen LDL-C is now calculated using the Martin-Hopkins  calculation, which is a validated novel method providing  better accuracy than the Friedewald equation in the  estimation of LDL-C.  Horald Pollen et al. Lenox Ahr. 2951;884(16): 2061-2068  (http://education.[facility].com/faq/FAQ164)   . Total CHOL/HDL Ratio 07/14/2020 4.8  <6.0 (calc) Final  . Non-HDL Cholesterol (Calc) 07/14/2020 150* <130 mg/dL (calc) Final   Comment: For patients with diabetes plus 1 major ASCVD risk  factor, treating to a non-HDL-C goal of <100 mg/dL  (LDL-C of <63 mg/dL) is considered a therapeutic  option.   . TSH 07/14/2020 2.79  0.40 - 4.50 mIU/L Final  . WBC 07/14/2020 5.8  3.8 - 10.8 Thousand/uL Final  . RBC 07/14/2020 4.80  4.20 - 5.80 Million/uL Final  . Hemoglobin 07/14/2020 15.4  13.2 - 17.1 g/dL Final  . HCT 01/60/1093 44.8  38 - 50 % Final  . MCV 07/14/2020 93.3  80.0 - 100.0 fL Final  . MCH 07/14/2020 32.1  27.0 - 33.0 pg Final  . MCHC 07/14/2020 34.4  32.0 - 36.0 g/dL Final  . RDW 23/55/7322 12.0  11.0 - 15.0 % Final  . Platelets 07/14/2020 197  140 - 400 Thousand/uL Final  . MPV 07/14/2020 10.5  7.5 - 12.5 fL Final  . Neutro Abs 07/14/2020 2,622  1,500 - 7,800 cells/uL Final  . Lymphs Abs 07/14/2020 2,187  850 - 3,900 cells/uL Final  . Absolute Monocytes 07/14/2020 574  200 -  950 cells/uL Final  . Eosinophils Absolute 07/14/2020 360  15 - 500 cells/uL Final  . Basophils Absolute 07/14/2020 58  0 - 200 cells/uL Final  . Neutrophils Relative % 07/14/2020 45.2  % Final  . Total Lymphocyte 07/14/2020 37.7  % Final  . Monocytes Relative 07/14/2020 9.9  % Final  . Eosinophils Relative 07/14/2020 6.2  % Final  . Basophils Relative 07/14/2020 1.0  % Final  . PSA 07/14/2020 0.9  < OR = 4.0 ng/mL Final   Comment: The total PSA value from this assay system is  standardized against the WHO standard. The test  result will be approximately 20% lower when compared  to the equimolar-standardized total PSA (Beckman  Coulter). Comparison of serial PSA results should be  interpreted with this fact in mind. . This test was performed using the Siemens  chemiluminescent method. Values obtained from  different assay methods cannot be used interchangeably. PSA levels, regardless of value, should not be interpreted as absolute evidence of the presence or absence of disease.   . Glucose, Bld 07/14/2020 127* 65 - 99 mg/dL Final   Comment: .            Fasting reference interval . For someone without known diabetes, a glucose value >125 mg/dL indicates that they may have diabetes and  this should be confirmed with a follow-up test. .   . BUN 07/14/2020 12  7 - 25 mg/dL Final  . Creat 16/10/960409/08/2020 0.96  0.70 - 1.25 mg/dL Final   Comment: For patients >66 years of age, the reference limit for Creatinine is approximately 13% higher for people identified as African-American. .   . GFR, Est Non African American 07/14/2020 82  > OR = 60 mL/min/1.3273m2 Final  . GFR, Est African American 07/14/2020 95  > OR = 60 mL/min/1.7073m2 Final  . BUN/Creatinine Ratio 07/14/2020 NOT APPLICABLE  6 - 22 (calc) Final  . Sodium 07/14/2020 140  135 - 146 mmol/L Final  . Potassium 07/14/2020 4.3  3.5 - 5.3 mmol/L Final  . Chloride 07/14/2020 105  98 - 110 mmol/L Final  . CO2 07/14/2020 25  20 - 32  mmol/L Final  . Calcium 07/14/2020 9.5  8.6 - 10.3 mg/dL Final  . Total Protein 07/14/2020 6.8  6.1 - 8.1 g/dL Final  . Albumin 54/09/811909/08/2020 4.4  3.6 - 5.1 g/dL Final  . Globulin 14/78/295609/08/2020 2.4  1.9 - 3.7 g/dL (calc) Final  . AG Ratio 07/14/2020 1.8  1.0 - 2.5 (calc) Final  . Total Bilirubin 07/14/2020 0.7  0.2 - 1.2 mg/dL Final  . Alkaline phosphatase (APISO) 07/14/2020 64  35 - 144 U/L Final  . AST 07/14/2020 19  10 - 35 U/L Final  . ALT 07/14/2020 14  9 - 46 U/L Final   Labs are significant for a blood sugar of 127 consistent with borderline diabetes, he also had low HDL cholesterol at 39 and elevated LDL cholesterol at 121.  He has a past medical history of prediabetes although his blood sugar has not been checked in quite some time.  He denies any chest pain shortness of breath or dyspnea on exertion.  His blood pressure today is elevated at 158/88.  He states that this is situational and whitecoat syndrome.  He states that his blood pressure is better controlled at home.  He denies tobacco abuse.  He denies falls.  He denies depression.  He denies memory loss.  He does report vertigo that has been occurring off and on for 2 years.  This most recent episode has been occurring now for the last 2 months.  We discussed Epley maneuvers however he refuses a referral to physical therapy at the present time.  He prefers to allow it to resolve on its own. Immunization History  Administered Date(s) Administered  . Moderna SARS-COVID-2 Vaccination 03/13/2020, 04/13/2020  . Pneumococcal Polysaccharide-23 07/18/2020  . Td 11/27/1998  . Tdap 12/10/2005, 01/01/2017   Patient is due for Pneumovax 23.  He is due for a flu shot.  He is due for the shingles shot.  He refuses a flu shot.  He declines a shingles shot today.  He will receive Pneumovax 23 Past Medical History:  Diagnosis Date  . Chronic constipation    Past Surgical History:  Procedure Laterality Date  . HERNIA REPAIR N/A    [employer]  07/17/2020  . INGUINAL HERNIA REPAIR  06/25/2012   left  . MOUTH SURGERY  2008   Current Outpatient Medications on File Prior to Visit  Medication Sig Dispense Refill  . albuterol (VENTOLIN HFA) 108 (90 Base) MCG/ACT inhaler Inhale 2 puffs into the lungs every 4 (four) hours as needed for wheezing or shortness of breath (cough/SOB/chest tightness). 1 Inhaler 0  . aspirin 81 MG tablet Take 81 mg by mouth daily.    .Marland Kitchen  benzonatate (TESSALON) 100 MG capsule Take 1 capsule (100 mg total) by mouth 3 (three) times daily as needed for cough. 30 capsule 0  . cetirizine (ZYRTEC) 10 MG tablet Take 1 tablet (10 mg total) by mouth daily. 90 tablet 1  . GLUCOSAMINE HCL PO Take by mouth.    . meclizine (ANTIVERT) 25 MG tablet TAKE 1 TABLET BY MOUTH THREE TIMES DAILY AS NEEDED FOR DIZZINESS 30 tablet 0  . Multiple Vitamin (MULTIVITAMIN) tablet Take 1 tablet by mouth daily.    . polyethylene glycol (MIRALAX / GLYCOLAX) packet Take 17 g by mouth daily.     No current facility-administered medications on file prior to visit.   No Known Allergies Social History   Socioeconomic History  . Marital status: Married    Spouse name: Not on file  . Number of children: Not on file  . Years of education: Not on file  . Highest education level: Not on file  Occupational History  . Not on file  Tobacco Use  . Smoking status: Former Smoker    Quit date: 06/11/1980    Years since quitting: 40.1  . Smokeless tobacco: Never Used  Substance and Sexual Activity  . Alcohol use: No    Alcohol/week: 0.0 standard drinks  . Drug use: No  . Sexual activity: Not on file  Other Topics Concern  . Not on file  Social History Narrative   Works in [employer]    Married. 1 child. 3 grandchildren.    No exercise.    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    Social Determinants of Health   Financial Resource Strain:   . Difficulty of Paying Living Expenses: Not on file  Food Insecurity:   . Worried About  Programme researcher, broadcasting/film/video in the Last Year: Not on file  . Ran Out of Food in the Last Year: Not on file  Transportation Needs:   . Lack of Transportation (Medical): Not on file  . Lack of Transportation (Non-Medical): Not on file  Physical Activity:   . Days of Exercise per Week: Not on file  . Minutes of Exercise per Session: Not on file  Stress:   . Feeling of Stress : Not on file  Social Connections:   . Frequency of Communication with Friends and Family: Not on file  . Frequency of Social Gatherings with Friends and Family: Not on file  . Attends Religious Services: Not on file  . Active Member of Clubs or Organizations: Not on file  . Attends Banker Meetings: Not on file  . Marital Status: Not on file  Intimate Partner Violence:   . Fear of Current or Ex-Partner: Not on file  . Emotionally Abused: Not on file  . Physically Abused: Not on file  . Sexually Abused: Not on file   Family History  Problem Relation Age of Onset  . Glaucoma Mother   . Hyperlipidemia Father      Review of Systems  All other systems reviewed and are negative.      Objective:   Physical Exam Vitals reviewed.  Constitutional:      General: He is not in acute distress.    Appearance: Normal appearance. He is normal weight. He is not ill-appearing, toxic-appearing or diaphoretic.  HENT:     Head: Normocephalic and atraumatic.     Right Ear: Tympanic membrane and ear canal normal. There is no impacted cerumen.     Left Ear: Tympanic membrane and ear  canal normal. There is no impacted cerumen.     Nose: Nose normal. No congestion or rhinorrhea.     Mouth/Throat:     Mouth: Mucous membranes are moist.     Pharynx: Oropharynx is clear. No oropharyngeal exudate or posterior oropharyngeal erythema.  Eyes:     General:        Right eye: No discharge.        Left eye: No discharge.     Extraocular Movements: Extraocular movements intact.     Conjunctiva/sclera: Conjunctivae normal.      Pupils: Pupils are equal, round, and reactive to light.  Neck:     Vascular: No carotid bruit.  Cardiovascular:     Rate and Rhythm: Normal rate and regular rhythm.     Pulses: Normal pulses.     Heart sounds: Normal heart sounds. No murmur heard.  No friction rub. No gallop.   Pulmonary:     Effort: Pulmonary effort is normal. No respiratory distress.     Breath sounds: Normal breath sounds. No stridor. No wheezing, rhonchi or rales.  Chest:     Chest wall: No tenderness.  Abdominal:     General: Bowel sounds are normal. There is no distension.     Palpations: Abdomen is soft. There is no mass.     Tenderness: There is no abdominal tenderness. There is no guarding or rebound.     Hernia: No hernia is present.  Musculoskeletal:        General: Normal range of motion.     Cervical back: Normal range of motion and neck supple. No rigidity or tenderness.     Right lower leg: No edema.     Left lower leg: No edema.  Lymphadenopathy:     Cervical: No cervical adenopathy.  Skin:    General: Skin is warm and dry.     Capillary Refill: Capillary refill takes less than 2 seconds.     Coloration: Skin is not jaundiced or pale.     Findings: No bruising, erythema, lesion or rash.  Neurological:     General: No focal deficit present.     Mental Status: He is oriented to person, place, and time. Mental status is at baseline.     Cranial Nerves: No cranial nerve deficit.     Motor: No weakness.     Coordination: Coordination normal.     Gait: Gait normal.     Deep Tendon Reflexes: Reflexes normal.  Psychiatric:        Mood and Affect: Mood normal.        Behavior: Behavior normal.        Thought Content: Thought content normal.        Judgment: Judgment normal.           Assessment & Plan:  Prediabetes - Plan: Hemoglobin A1c  Immunization due - Plan: Pneumococcal polysaccharide vaccine 23-valent greater than or equal to 2yo subcutaneous/IM  Elevated blood pressure  reading  Pure hypercholesterolemia  General medical exam  PSA is excellent.  There is no evidence of prostate cancer.  Patient states that he had a colonoscopy performed at Dr. Marlane Hatcher office 5 years ago.  He states that he is not due for another colonoscopy for 5 more years.  Recommended Pneumovax 23 which he received today.  Recommended a flu shot which he declined.  We also discussed the shingles vaccine.  Recommended Epley maneuvers for vertigo but he declined.  Patient will check his blood pressure every  day for the next week and report the values to me.  If consistently greater than 140/90 I will start the patient on an angiotensin receptor blocker given his history of borderline diabetes.  I will add a hemoglobin A1c to his blood work.  If greater than 6.5, we will discuss treatment for diabetes.  I will start the patient on Crestor 10 mg a day given his borderline diabetes and elevated cholesterol.

## 2020-07-19 LAB — CBC WITH DIFFERENTIAL/PLATELET
Absolute Monocytes: 574 cells/uL (ref 200–950)
Basophils Absolute: 58 cells/uL (ref 0–200)
Basophils Relative: 1 %
Eosinophils Absolute: 360 cells/uL (ref 15–500)
Eosinophils Relative: 6.2 %
HCT: 44.8 % (ref 38.5–50.0)
Hemoglobin: 15.4 g/dL (ref 13.2–17.1)
Lymphs Abs: 2187 cells/uL (ref 850–3900)
MCH: 32.1 pg (ref 27.0–33.0)
MCHC: 34.4 g/dL (ref 32.0–36.0)
MCV: 93.3 fL (ref 80.0–100.0)
MPV: 10.5 fL (ref 7.5–12.5)
Monocytes Relative: 9.9 %
Neutro Abs: 2622 cells/uL (ref 1500–7800)
Neutrophils Relative %: 45.2 %
Platelets: 197 10*3/uL (ref 140–400)
RBC: 4.8 10*6/uL (ref 4.20–5.80)
RDW: 12 % (ref 11.0–15.0)
Total Lymphocyte: 37.7 %
WBC: 5.8 10*3/uL (ref 3.8–10.8)

## 2020-07-19 LAB — TEST AUTHORIZATION

## 2020-07-19 LAB — COMPLETE METABOLIC PANEL WITH GFR
AG Ratio: 1.8 (calc) (ref 1.0–2.5)
ALT: 14 U/L (ref 9–46)
AST: 19 U/L (ref 10–35)
Albumin: 4.4 g/dL (ref 3.6–5.1)
Alkaline phosphatase (APISO): 64 U/L (ref 35–144)
BUN: 12 mg/dL (ref 7–25)
CO2: 25 mmol/L (ref 20–32)
Calcium: 9.5 mg/dL (ref 8.6–10.3)
Chloride: 105 mmol/L (ref 98–110)
Creat: 0.96 mg/dL (ref 0.70–1.25)
GFR, Est African American: 95 mL/min/{1.73_m2} (ref >=60)
GFR, Est Non African American: 82 mL/min/{1.73_m2} (ref >=60)
Globulin: 2.4 g/dL (calc) (ref 1.9–3.7)
Glucose, Bld: 127 mg/dL — ABNORMAL HIGH (ref 65–99)
Potassium: 4.3 mmol/L (ref 3.5–5.3)
Sodium: 140 mmol/L (ref 135–146)
Total Bilirubin: 0.7 mg/dL (ref 0.2–1.2)
Total Protein: 6.8 g/dL (ref 6.1–8.1)

## 2020-07-19 LAB — HEMOGLOBIN A1C W/OUT EAG: Hgb A1c MFr Bld: 6.2 % of total Hgb — ABNORMAL HIGH (ref <5.7)

## 2020-07-19 LAB — LIPID PANEL
Cholesterol: 189 mg/dL (ref <200)
HDL: 39 mg/dL — ABNORMAL LOW (ref >=40)
LDL Cholesterol (Calc): 123 mg/dL (calc) — ABNORMAL HIGH
Non-HDL Cholesterol (Calc): 150 mg/dL (calc) — ABNORMAL HIGH (ref <130)
Total CHOL/HDL Ratio: 4.8 (calc) (ref <5.0)
Triglycerides: 154 mg/dL — ABNORMAL HIGH (ref <150)

## 2020-07-19 LAB — TSH: TSH: 2.79 mIU/L (ref 0.40–4.50)

## 2020-07-19 LAB — PSA: PSA: 0.9 ng/mL (ref <=4.0)

## 2020-07-21 ENCOUNTER — Encounter: Payer: Self-pay | Admitting: Family Medicine

## 2021-03-02 ENCOUNTER — Other Ambulatory Visit (HOSPITAL_COMMUNITY): Payer: Self-pay

## 2024-03-22 DIAGNOSIS — Z6831 Body mass index (BMI) 31.0-31.9, adult: Secondary | ICD-10-CM | POA: Diagnosis not present

## 2024-03-22 DIAGNOSIS — E1165 Type 2 diabetes mellitus with hyperglycemia: Secondary | ICD-10-CM | POA: Diagnosis not present

## 2024-03-22 DIAGNOSIS — E782 Mixed hyperlipidemia: Secondary | ICD-10-CM | POA: Diagnosis not present

## 2024-03-22 DIAGNOSIS — E7849 Other hyperlipidemia: Secondary | ICD-10-CM | POA: Diagnosis not present

## 2024-03-22 DIAGNOSIS — E559 Vitamin D deficiency, unspecified: Secondary | ICD-10-CM | POA: Diagnosis not present

## 2024-05-21 ENCOUNTER — Encounter: Payer: Self-pay | Admitting: Advanced Practice Midwife

## 2024-05-24 DIAGNOSIS — H524 Presbyopia: Secondary | ICD-10-CM | POA: Diagnosis not present

## 2024-05-24 DIAGNOSIS — Z0100 Encounter for examination of eyes and vision without abnormal findings: Secondary | ICD-10-CM | POA: Diagnosis not present

## 2024-05-24 DIAGNOSIS — H40013 Open angle with borderline findings, low risk, bilateral: Secondary | ICD-10-CM | POA: Diagnosis not present
